# Patient Record
Sex: Female | Born: 1990 | Race: Black or African American | Hispanic: No | Marital: Single | Smoking: Never smoker
Health system: Southern US, Community
[De-identification: ages and names within clinical notes are randomized; demographics above are authoritative.]

## PROBLEM LIST (undated history)

## (undated) DIAGNOSIS — T7840XA Allergy, unspecified, initial encounter: Secondary | ICD-10-CM

## (undated) DIAGNOSIS — T783XXA Angioneurotic edema, initial encounter: Secondary | ICD-10-CM

## (undated) DIAGNOSIS — B009 Herpesviral infection, unspecified: Secondary | ICD-10-CM

## (undated) DIAGNOSIS — D219 Benign neoplasm of connective and other soft tissue, unspecified: Secondary | ICD-10-CM

## (undated) HISTORY — DX: Herpesviral infection, unspecified: B00.9

## (undated) HISTORY — DX: Allergy, unspecified, initial encounter: T78.40XA

## (undated) HISTORY — DX: Angioneurotic edema, initial encounter: T78.3XXA

## (undated) HISTORY — PX: TONSILLECTOMY: SUR1361

---
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
## 2010-05-18 ENCOUNTER — Ambulatory Visit: Payer: Self-pay | Admitting: Family Medicine

## 2010-07-06 ENCOUNTER — Ambulatory Visit: Payer: Self-pay | Admitting: Internal Medicine

## 2010-07-06 ENCOUNTER — Encounter (INDEPENDENT_AMBULATORY_CARE_PROVIDER_SITE_OTHER): Payer: Self-pay | Admitting: Family Medicine

## 2010-07-06 LAB — CONVERTED CEMR LAB
Albumin: 4.8 g/dL (ref 3.5–5.2)
Alkaline Phosphatase: 47 units/L (ref 39–117)
CO2: 25 meq/L (ref 19–32)
Eosinophils Absolute: 0.3 10*3/uL (ref 0.0–0.7)
Glucose, Bld: 88 mg/dL (ref 70–99)
LDL Cholesterol: 104 mg/dL — ABNORMAL HIGH (ref 0–99)
Lymphocytes Relative: 37 % (ref 12–46)
Lymphs Abs: 2 10*3/uL (ref 0.7–4.0)
Neutro Abs: 2.5 10*3/uL (ref 1.7–7.7)
Neutrophils Relative %: 46 % (ref 43–77)
Platelets: 300 10*3/uL (ref 150–400)
Potassium: 4.1 meq/L (ref 3.5–5.3)
Sodium: 136 meq/L (ref 135–145)
TSH: 2.032 microintl units/mL (ref 0.350–4.500)
Total Protein: 7.7 g/dL (ref 6.0–8.3)
Triglycerides: 69 mg/dL (ref <150)
Vit D, 25-Hydroxy: 21 ng/mL — ABNORMAL LOW (ref 30–89)
WBC: 5.5 10*3/uL (ref 4.0–10.5)

## 2010-09-01 ENCOUNTER — Encounter (INDEPENDENT_AMBULATORY_CARE_PROVIDER_SITE_OTHER): Payer: Self-pay | Admitting: Family Medicine

## 2010-09-01 LAB — CONVERTED CEMR LAB: Chlamydia, DNA Probe: NEGATIVE

## 2011-04-13 NOTE — Op Note (Signed)
NAMEMALLEY, HAUTER NO.:  [id number]   MEDICAL RECORD NO.:  [id number]                   PATIENT TYPE:   LOCATION:                                       FACILITY:   PHYSICIAN:  Dorna Leitz, M.D.                 DATE OF BIRTH:   DATE OF PROCEDURE:  06/26/2002  DATE OF DISCHARGE:                                 OPERATIVE REPORT   PREOPERATIVE DIAGNOSES:  Obstructive sleep apnea, recurrent chronic  tonsillitis.   PREOPERATIVE DIAGNOSES:  Obstructive sleep apnea, recurrent chronic  tonsillitis.   PROCEDURE:  Adenotonsillectomy.   SURGEON:  Dorna Leitz, M.D.   ANESTHESIA:  General endotracheal.   ESTIMATED BLOOD LOSS:  30 cc.   SPECIMENS:  Adenoids from tonsils.   COMPLICATIONS:  None.   INDICATIONS FOR PROCEDURE:  This patient is an 20 year old female with an 8  month history of dysphagia and swollen tonsils. There has been frequent sore  throats. In addition, there has been apnea at night and mouth breathing. For  these reasons, adenotonsillectomy is performed.   FINDINGS:  The patient was noted to have kissing bilateral palatine tonsils  which were markedly cryptic and contained a significant amount of tonsillar  debris. In addition, there was debris and an obstructing amount of adenoid  tissue. This tissue was extending into the posterior carina bilaterally.   DESCRIPTION OF PROCEDURE:  The patient was taken to the operating room and  placed on the table in the supine position. She was then placed under  general endotracheal anesthesia and the table rotated counter clockwise 90  degrees. The neck was gently extended using a shoulder roll and the head and  body draped in the usual fashion. A Crowe-Davis mouth gag with a #4 tongue  blade was then placed intraorally, opened and suspended down the Mayo stand.  Palpation of the soft palate was without evidence of any submucosal flecks.  A red rubber catheter was placed down the right  nostril and brought out  through the oral cavity and secured in place with a hemostat. Inspection of  the nasopharynx was performed using a mirror. A medium size adenoid curette  was then placed against the vomer and directly inferiorly severing the  majority of the adenoid pad. The remainder was removed using [facility].  Claire forceps. Two sterile gauze Afrin soaked packs were placed in the  nasopharynx and time allowed for hemostasis. The palate was relaxed and the  right palatine tonsil grasped with Allis clamps and directed inferomedially.  The harmonic scalpel was then used to excise the tonsil staying within the  peritonsillar space adjacent to the tonsillar capsule. The left tonsil was  removed in an identical fashion. The palate was resuspended and packs  removed. Suction cautery was then used to ensure hemostasis and to reduce  adenoid tissue extending into the nasal cavity. The nasopharynx was  copiously irrigated transnasally with normal saline which was suctioned out  through the oral cavity. An NG tube was placed down the esophagus for  suctioning of the gastric contents. The  mouth gag was removed noting no damage to the teeth or soft tissues. The  table was rotated clockwise 90 degrees to its original position.  The  patient was awakened from anesthesia and extubated in the operating room.  She was taken to the post anesthesia care unit in stable condition. There  were no complications.                                               Dorna Leitz, M.D.    SLJ/MEDQ  D:  06/26/2002  T:  07/02/2002  Job:  [id number]   cc:   Maurice March, M.D.

## 2011-08-31 LAB — POCT RAPID STREP A: Streptococcus, Group A Screen (Direct): NEGATIVE

## 2012-03-25 ENCOUNTER — Other Ambulatory Visit: Payer: Self-pay | Admitting: Family Medicine

## 2012-06-02 ENCOUNTER — Emergency Department (HOSPITAL_COMMUNITY)
Admission: EM | Admit: 2012-06-02 | Discharge: 2012-06-02 | Disposition: A | Discharge status: Home / Self Care | Payer: No Typology Code available for payment source | Attending: Emergency Medicine | Admitting: Emergency Medicine

## 2012-06-02 ENCOUNTER — Encounter (HOSPITAL_COMMUNITY): Payer: Self-pay | Admitting: Family Medicine

## 2012-06-02 DIAGNOSIS — M542 Cervicalgia: Secondary | ICD-10-CM | POA: Insufficient documentation

## 2012-06-02 DIAGNOSIS — M25569 Pain in unspecified knee: Secondary | ICD-10-CM | POA: Insufficient documentation

## 2012-06-02 DIAGNOSIS — M25519 Pain in unspecified shoulder: Secondary | ICD-10-CM | POA: Insufficient documentation

## 2012-06-02 MED ORDER — HYDROCODONE-ACETAMINOPHEN 5-325 MG PO TABS: 2.0000 | ORAL_TABLET | ORAL | Status: AC | PRN

## 2012-06-02 MED ORDER — IBUPROFEN 800 MG PO TABS: 800.0000 mg | ORAL_TABLET | Freq: Three times a day (TID) | ORAL | Status: AC

## 2012-06-02 MED ORDER — DIAZEPAM 5 MG PO TABS: 5.0000 mg | ORAL_TABLET | Freq: Two times a day (BID) | ORAL | Status: AC

## 2012-06-02 NOTE — ED Provider Notes (Signed)
Medical screening examination/treatment/procedure(s) were performed by non-physician practitioner and as supervising physician I was immediately available for consultation/collaboration.  Doug Sou, MD 06/02/12 [phone]
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
## 2013-03-17 ENCOUNTER — Ambulatory Visit (INDEPENDENT_AMBULATORY_CARE_PROVIDER_SITE_OTHER): Payer: BC Managed Care – PPO | Admitting: Physician Assistant

## 2013-03-17 VITALS — BP 110/80 | HR 94 | Temp 98.3°F | Resp 16 | Ht 63.75 in | Wt 174.0 lb

## 2013-03-17 DIAGNOSIS — J309 Allergic rhinitis, unspecified: Secondary | ICD-10-CM

## 2013-03-17 DIAGNOSIS — B373 Candidiasis of vulva and vagina: Secondary | ICD-10-CM

## 2013-03-17 DIAGNOSIS — Z202 Contact with and (suspected) exposure to infections with a predominantly sexual mode of transmission: Secondary | ICD-10-CM

## 2013-03-17 DIAGNOSIS — Z309 Encounter for contraceptive management, unspecified: Secondary | ICD-10-CM

## 2013-03-17 DIAGNOSIS — B3731 Acute candidiasis of vulva and vagina: Secondary | ICD-10-CM

## 2013-03-17 DIAGNOSIS — L309 Dermatitis, unspecified: Secondary | ICD-10-CM

## 2013-03-17 DIAGNOSIS — Z2089 Contact with and (suspected) exposure to other communicable diseases: Secondary | ICD-10-CM

## 2013-03-17 DIAGNOSIS — L259 Unspecified contact dermatitis, unspecified cause: Secondary | ICD-10-CM

## 2013-03-17 LAB — POCT URINE PREGNANCY: Preg Test, Ur: NEGATIVE

## 2013-03-17 LAB — POCT WET PREP WITH KOH
KOH Prep POC: POSITIVE
Trichomonas, UA: NEGATIVE
Yeast Wet Prep HPF POC: NEGATIVE

## 2013-03-17 MED ORDER — FLUCONAZOLE 150 MG PO TABS: 150.0000 mg | ORAL_TABLET | Freq: Once | ORAL | Status: DC

## 2013-03-17 MED ORDER — NORGESTIMATE-ETH ESTRADIOL 0.25-35 MG-MCG PO TABS: 1.0000 | ORAL_TABLET | Freq: Every day | ORAL | Status: DC

## 2013-03-17 MED ORDER — TRIAMCINOLONE ACETONIDE 0.1 % EX CREA: TOPICAL_CREAM | Freq: Two times a day (BID) | CUTANEOUS | Status: DC

## 2013-03-17 MED ORDER — CETIRIZINE HCL 10 MG PO TABS: 10.0000 mg | ORAL_TABLET | Freq: Every day | ORAL | Status: DC

## 2013-03-17 MED ORDER — FLUTICASONE PROPIONATE 50 MCG/ACT NA SUSP: 2.0000 | Freq: Every day | NASAL | Status: DC

## 2013-03-17 NOTE — Progress Notes (Signed)
Subjective:    Patient ID: Ruth Graham, female    DOB: 01-20-1991, 22 y.o.   MRN: [id number]  HPI 22 year old female presents for several concerns  #1) Vaginal discharge x 1 week.  Saw GYN on 03/05/13 and was diagnosed with BV and treated x 7 days. 1-2 days after completing this treatment, she noticed a thick, white, discharge. Does have some vaginal pruritis.  Admits that at the last visit with the GYN she had full STI screening and pap.  Everything negative per patient.  She is currently sexually active with 1 female partner and does not use condoms and she is not on any form of contraception.  Does not wish to become pregnant at this time.  LNMP 02/23/13.  Despite negative GC/CL 2 weeks ago, she would like to be tested again today.   #2) Refills on triamcinolone for her eczema. Well controlled with this, has sporadic flares but that is limited.    #3)  Refills on Flonase and Zyrtec that she uses for her environmental allergies - controlled.   Patient otherwise healthy with no other concerns today.     Review of Systems  Constitutional: Negative for fever and chills.  HENT: Negative for congestion and rhinorrhea.   Gastrointestinal: Negative for nausea, vomiting and abdominal pain.  Genitourinary: Positive for vaginal discharge. Negative for dysuria, urgency, flank pain, vaginal bleeding, difficulty urinating and vaginal pain.  Skin: Positive for rash (eczema).  Allergic/Immunologic: Positive for environmental allergies.  Neurological: Negative for dizziness and headaches.       Objective:   Physical Exam  Constitutional: She is oriented to person, place, and time. She appears well-developed and well-nourished.  HENT:  Head: Normocephalic and atraumatic.  Right Ear: External ear normal.  Left Ear: External ear normal.  Eyes: Conjunctivae are normal.  Neck: Normal range of motion.  Cardiovascular: Normal rate, regular rhythm and normal heart sounds.   Pulmonary/Chest: Effort  normal and breath sounds normal.  Abdominal: Bowel sounds are normal. There is no tenderness. There is no rebound and no guarding.  Genitourinary: Vagina normal and uterus normal. Pelvic exam was performed with patient supine. Cervix exhibits discharge (thick, white). Cervix exhibits no motion tenderness and no friability. Right adnexum displays no mass, no tenderness and no fullness. Left adnexum displays no mass, no tenderness and no fullness.  Neurological: She is alert and oriented to person, place, and time.  Psychiatric: She has a normal mood and affect. Her behavior is normal. Judgment and thought content normal.      Results for orders placed in visit on 03/17/13  POCT WET PREP WITH KOH      Result Value Range   Trichomonas, UA Negative     Clue Cells Wet Prep HPF POC 1-3     Epithelial Wet Prep HPF POC 2-3     Yeast Wet Prep HPF POC neg     Bacteria Wet Prep HPF POC 2+     RBC Wet Prep HPF POC 0-2     WBC Wet Prep HPF POC 10-20     KOH Prep POC Positive    POCT URINE PREGNANCY      Result Value Range   Preg Test, Ur Negative         Assessment & Plan:  1. Allergic rhinitis - Plan: fluticasone (FLONASE) 50 MCG/ACT nasal spray, cetirizine (ZYRTEC) 10 MG tablet  -Continue Flonase twice daily   -Zyrtec daily 2. Exposure to venereal disease - Plan: GC/Chlamydia Probe Amp,  POCT Wet Prep with KOH  -Labs pending  -Diflucan 150 mg po x 1 dose. May repeat in 7 days if symptoms persist 3. Eczema - Plan: triamcinolone cream (KENALOG) 0.1 %  -Continue triamcinolone cream as needed for eczema flares 4. Contraception management - Plan: POCT urine pregnancy, norgestimate-ethinyl estradiol (ORTHO-CYCLEN,SPRINTEC,PREVIFEM) 0.25-35 MG-MCG tablet  -HCG today negative  -Safe sex practices  -Have prescribed OCP's to start. Patient is going to decide if she would like to begin therapy. I have strongly urged her  to do so 5.  Yeast vaginitis - Plan: fluconazole (DIFLUCAN) 150 MG tablet  -As  above

## 2013-03-18 LAB — GC/CHLAMYDIA PROBE AMP
CT Probe RNA: NEGATIVE
GC Probe RNA: NEGATIVE

## 2013-03-19 ENCOUNTER — Encounter: Payer: Self-pay | Admitting: Family Medicine

## 2013-05-04 ENCOUNTER — Ambulatory Visit (INDEPENDENT_AMBULATORY_CARE_PROVIDER_SITE_OTHER): Payer: BC Managed Care – PPO | Admitting: Emergency Medicine

## 2013-05-04 VITALS — BP 139/82 | HR 99 | Temp 99.1°F | Resp 16 | Ht 63.0 in | Wt 181.0 lb

## 2013-05-04 DIAGNOSIS — T7840XA Allergy, unspecified, initial encounter: Secondary | ICD-10-CM

## 2013-05-04 DIAGNOSIS — Z202 Contact with and (suspected) exposure to infections with a predominantly sexual mode of transmission: Secondary | ICD-10-CM

## 2013-05-04 DIAGNOSIS — T783XXA Angioneurotic edema, initial encounter: Secondary | ICD-10-CM

## 2013-05-04 DIAGNOSIS — R220 Localized swelling, mass and lump, head: Secondary | ICD-10-CM

## 2013-05-04 DIAGNOSIS — Z2089 Contact with and (suspected) exposure to other communicable diseases: Secondary | ICD-10-CM

## 2013-05-04 HISTORY — DX: Angioneurotic edema, initial encounter: T78.3XXA

## 2013-05-04 LAB — POCT WET PREP WITH KOH: Trichomonas, UA: NEGATIVE

## 2013-05-04 MED ORDER — METHYLPREDNISOLONE SODIUM SUCC 125 MG IJ SOLR
125.0000 mg | Freq: Once | INTRAMUSCULAR | Status: AC
  Administered 2013-05-04: 125 mg via INTRAMUSCULAR

## 2013-05-04 MED ORDER — EPINEPHRINE 0.3 MG/0.3ML IJ SOAJ: 0.3000 mg | Freq: Once | INTRAMUSCULAR | Status: DC

## 2013-05-04 MED ORDER — METRONIDAZOLE 0.75 % VA GEL: 1.0000 | Freq: Two times a day (BID) | VAGINAL | Status: DC

## 2013-05-04 MED ORDER — PREDNISONE 10 MG PO TABS: 10.0000 mg | ORAL_TABLET | Freq: Every day | ORAL | Status: DC

## 2013-05-04 NOTE — Patient Instructions (Signed)
Bacterial Vaginosis Bacterial vaginosis (BV) is a vaginal infection where the normal balance of bacteria in the vagina is disrupted. The normal balance is then replaced by an overgrowth of certain bacteria. There are several different kinds of bacteria that can cause BV. BV is the most common vaginal infection in women of childbearing age. CAUSES   The cause of BV is not fully understood. BV develops when there is an increase or imbalance of harmful bacteria.  Some activities or behaviors can upset the normal balance of bacteria in the vagina and put women at increased risk including:  Having a new sex partner or multiple sex partners.  Douching.  Using an intrauterine device (IUD) for contraception.  It is not clear what role sexual activity plays in the development of BV. However, women that have never had sexual intercourse are rarely infected with BV. Women do not get BV from toilet seats, bedding, swimming pools or from touching objects around them.  SYMPTOMS   Grey vaginal discharge.  A fish-like odor with discharge, especially after sexual intercourse.  Itching or burning of the vagina and vulva.  Burning or pain with urination.  Some women have no signs or symptoms at all. DIAGNOSIS  Your caregiver must examine the vagina for signs of BV. Your caregiver will perform lab tests and look at the sample of vaginal fluid through a microscope. They will look for bacteria and abnormal cells (clue cells), a pH test higher than 4.5, and a positive amine test all associated with BV.  RISKS AND COMPLICATIONS   Pelvic inflammatory disease (PID).  Infections following gynecology surgery.  Developing HIV.  Developing herpes virus. TREATMENT  Sometimes BV will clear up without treatment. However, all women with symptoms of BV should be treated to avoid complications, especially if gynecology surgery is planned. Female partners generally do not need to be treated. However, BV may spread  between female sex partners so treatment is helpful in preventing a recurrence of BV.   BV may be treated with antibiotics. The antibiotics come in either pill or vaginal cream forms. Either can be used with nonpregnant or pregnant women, but the recommended dosages differ. These antibiotics are not harmful to the baby.  BV can recur after treatment. If this happens, a second round of antibiotics will often be prescribed.  Treatment is important for pregnant women. If not treated, BV can cause a premature delivery, especially for a pregnant woman who had a premature birth in the past. All pregnant women who have symptoms of BV should be checked and treated.  For chronic reoccurrence of BV, treatment with a type of prescribed gel vaginally twice a week is helpful. HOME CARE INSTRUCTIONS   Finish all medication as directed by your caregiver.  Do not have sex until treatment is completed.  Tell your sexual partner that you have a vaginal infection. They should see their caregiver and be treated if they have problems, such as a mild rash or itching.  Practice safe sex. Use condoms. Only have 1 sex partner. PREVENTION  Basic prevention steps can help reduce the risk of upsetting the natural balance of bacteria in the vagina and developing BV:  Do not have sexual intercourse (be abstinent).  Do not douche.  Use all of the medicine prescribed for treatment of BV, even if the signs and symptoms go away.  Tell your sex partner if you have BV. That way, they can be treated, if needed, to prevent reoccurrence. SEEK MEDICAL CARE IF:     Your symptoms are not improving after 3 days of treatment.  You have increased discharge, pain, or fever. MAKE SURE YOU:   Understand these instructions.  Will watch your condition.  Will get help right away if you are not doing well or get worse. FOR MORE INFORMATION  Division of STD Prevention (DSTDP), Centers for Disease Control and Prevention:  SolutionApps.[employer]za American Social Health Association (ASHA): www.ashastd.org  Document Released: 11/12/2005 Document Revised: 02/04/2012 Document Reviewed: 05/05/2009 [facility] Patient Information 2014 [place], [place]. Angioedema Angioedema (AE) is a sudden swelling of the eyelids, lips, lobes of ears, external genitalia, skin, and other parts of the body. AE can happen by itself. It usually begins during the night and is found on awakening. It can happen with hives and other allergic reactions. Attacks can be mild and annoying, or life-threatening if the air passages swell. AE generally occurs in a short time period (over minutes to hours) and gets better in 24 to 48 hours. It usually does not cause any serious problems.  There are 2 different kinds of AE:   Allergic AE.  Nonallergic AE.  There may be an overreaction or direct stimulation of cells that are a part of the immune system (mast cells).  There may be problems with the release of chemicals made by the body that cause swelling and inflammation (kinins). AE due to kinins can be inherited from parents (hereditary), or it can develop on its own (acquired). Acquired AE either shows up before, or along with, certain diseases or is due to the body's immune system attacking parts of the body's own cells (autoimmune). CAUSES  Allergic  AE due to allergic reactions are caused by something that causes the body to react (trigger). Common triggers include:  Foods.  Medicines.  Latex.  Direct contact with certain fruits, vegetables, or animal saliva.  Insect stings. Nonallergic  Mast cell stimulation may be caused by:  Medicines.  Dyes used in X-rays.  The body's own immune system reactions to parts of the body (autoimmune disease).  Possibly, some virus infections.  AE due to problems with kinins can be hereditary or acquired. Attacks are triggered by:  Mild injury.  Dental work or any surgery.  Stress.  Sudden changes in  temperature.  Exercise.  Medicines.  AE due to problems with kinins can also be due to certain medicines, especially blood pressure medicines like angiotensin-converting enzyme (ACE) inhibitors. African Americans are at nearly 5 times greater risk of developing AE than Caucasians from ACE inhibitors. SYMPTOMS  Allergic symptoms:  Non-itchy swelling of the skin. Often the swelling is on the face and lips, but any area of the skin can swell. Sometimes, the swelling can be painful. If hives are present, there is intense itching.  Breathing problems if the air passages swell. Nonallergic symptoms:  If internal organs are involved, there may be:  Nausea.  Abdominal pain.  Vomiting.  Difficulty swallowing.  Difficulty passing urine.  Breathing problems if the air passages swell. Depending on the cause of AE, episodes may:  Only happen once (if triggers are removed or avoided).  Come back in unpredictable patterns.  Repeat for several years and then gradually fade away. DIAGNOSIS  AE is diagnosed by:   Asking questions to find out how fast the symptoms began.  Taking a family history.  Physical exam.  Diagnostic tests. Tests could include:  Allergy skin tests to see if the problem is allergic.  Blood tests to diagnose hereditary and some acquired types of AE.  Other tests to see if there is a hidden disease leading to the AE. TREATMENT  Treatment depends on the type and cause (if any) of the AE. Allergic  Allergic types of AE are treated with:  Immediate removal of the trigger or medicine (if any).  Epinephrine injection.  Steroids.  Antihistamines.  Hospitalization for severe attacks. Nonallergic  Mast cell stimulation types of AE are treated with:  Immediate removal of the trigger or medicine (if any).  Epinephrine injection.  Steroids.  Antihistamines.  Hospitalization for severe attacks.  Hereditary AE is treated with:  Medicines to  prevent and treat attacks. There is little response to antihistamines, epinephrine, or steroids.  Preventive medicines before dental work or surgery.  Removing or avoiding medicines that trigger attacks.  Hospitalization for severe attacks.  Acquired AE is treated with:  Treating underlying disease (if any).  Medicines to prevent and treat attacks. HOME CARE INSTRUCTIONS   Always carry your emergency allergy treatment medicines with you.  Wear a medical bracelet.  Avoid known triggers. SEEK MEDICAL CARE IF:   You get repeat attacks.  Your attacks are more frequent or more severe despite preventive measures.  You have hereditary AE and are considering having children. It is important to discuss the risks of passing this on to your children. SEEK IMMEDIATE MEDICAL CARE IF:   You have difficulty breathing.  You have difficulty swallowing.  You experience fainting. This condition should be treated immediately. It can be life-threatening if it involves throat swelling. Document Released: 01/21/2002 Document Revised: 02/04/2012 Document Reviewed: 11/11/2008 [facility] Patient Information 2014 [place], [place].

## 2013-05-04 NOTE — Progress Notes (Signed)
  Subjective:    Patient ID: Ruth Graham, female    DOB: 1991-01-07, 22 y.o.   MRN: [id number]  HPI problem #1 is swelling of the right side of the face. Last night she had hot dogs, hamburger,s and dorito chips at 11 PM last night she noticed swelling of her right side of the cheek her upper and lower lip on the right side. She did not have any respiratory difficulty she did not feel short of breath. She felt no swelling on the inside of her mouth or throat. She has had 3-4 episodes of this in the past. 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 She has not had any recent problems. She does not have an EpiPen to carry . Problem #2 is possible STD exposure. She is sexually active. She does not use birth control. She denies having a discharge. States she last had STD testing in April.   Review of Systems     Objective:   Physical Exam patient is alert and cooperative she is not in distress. There is swelling of the right upper lip and lower lip. There is puffiness to the right cheek area. There is no swelling of the uvula or posterior pharynx. No stridor is heard  .She is clear to both auscultation and percussion. Heart  exam reveals a regular rate and rhythm  The abdomen is soft. The cervix is extremely friable. There are no adnexal masses present. There is a thick yellowish discharge in the vault. Results for orders placed in visit on 05/04/13  POCT URINE PREGNANCY      Result Value Range   Preg Test, Ur Negative    POCT WET PREP WITH KOH      Result Value Range   Trichomonas, UA Negative     Clue Cells Wet Prep HPF POC 6-8     Epithelial Wet Prep HPF POC 8-12     Yeast Wet Prep HPF POC neg     Bacteria Wet Prep HPF POC 2+     RBC Wet Prep HPF POC 1-5     WBC Wet Prep HPF POC 10-15     KOH Prep POC Negative         Assessment & Plan:  Patient treated with Solu-Medrol 125 mg IM Zyrtec 10 mg a by mouth and ranitidine 300 mg by mouth.  STD testing was done she is having a Pap 3 done to include gonorrhea Chlamydia testing she also had testing for syphilis HIV and hep C and HSV 1 and 2. BV to be treated once her angioedema is resolved we'll treat with MetroGel vaginal.Prednisone taper ordered with referral to allergist. Instucted on epi pen use

## 2013-05-05 LAB — RPR

## 2013-05-05 LAB — HSV(HERPES SIMPLEX VRS) I + II AB-IGG: HSV 1 Glycoprotein G Ab, IgG: 8.26 IV — ABNORMAL HIGH

## 2013-05-05 LAB — HEPATITIS C ANTIBODY: HCV Ab: NEGATIVE

## 2013-05-05 LAB — PAP IG, CT-NG, RFX HPV ASCU

## 2013-05-06 ENCOUNTER — Telehealth: Payer: Self-pay

## 2013-05-06 MED ORDER — METRONIDAZOLE 500 MG PO TABS: 500.0000 mg | ORAL_TABLET | Freq: Two times a day (BID) | ORAL | Status: DC

## 2013-05-06 NOTE — Telephone Encounter (Signed)
Can we do this for her? 

## 2013-05-06 NOTE — Telephone Encounter (Signed)
Sent PO Flagyl to pharmacy.  She CANNOT consume alcohol with this medication or for 48 hours after last dose.

## 2013-05-06 NOTE — Telephone Encounter (Signed)
Pt states that she prescribed a cream for BV but she would rather take the pills. Best# [phone]  Pharmacy:[facility] [address]

## 2013-05-06 NOTE — Telephone Encounter (Signed)
lmom that rx was sent into pharmacy 

## 2013-05-07 ENCOUNTER — Ambulatory Visit (INDEPENDENT_AMBULATORY_CARE_PROVIDER_SITE_OTHER): Payer: BC Managed Care – PPO | Admitting: Family Medicine

## 2013-05-07 VITALS — BP 153/70 | HR 110 | Temp 99.1°F | Resp 16 | Ht 63.0 in | Wt 181.0 lb

## 2013-05-07 DIAGNOSIS — B009 Herpesviral infection, unspecified: Secondary | ICD-10-CM

## 2013-05-07 MED ORDER — VALACYCLOVIR HCL 1 G PO TABS: 1000.0000 mg | ORAL_TABLET | Freq: Two times a day (BID) | ORAL | Status: DC

## 2013-05-07 NOTE — Progress Notes (Signed)
Subjective: Patient is here to followup on her recent labs. 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 She is doing better from her angioedema.  Objective: Did not examine her  Assessment: HSV 2  Plan: Had a long discussion with her explaining options. Since this is the first time she's been diagnosed, even though she did not have an outbreak, I decided to go ahead and give her a round of treatment. This can give her a little more comfort on something been done about it. She is pretty discouraged with being positive. She is known with her boyfriend. She only had one sexual partner for the last year. Told her to call us if she has any acute outbreaks at which time should be treated promptly.

## 2013-05-07 NOTE — Patient Instructions (Signed)
Genital Herpes  Genital herpes is a sexually transmitted disease. This means that it is a disease passed by having sex with an infected person. There is no cure for genital herpes. The time between attacks can be months to years. The virus may live in a person but produce no problems (symptoms). This infection can be passed to a baby as it travels down the birth canal (vagina). In a newborn, this can cause central nervous system damage, eye damage, or even death. The virus that causes genital herpes is usually HSV-2 virus. The virus that causes oral herpes is usually HSV-1. The diagnosis (learning what is wrong) is made through culture results.  SYMPTOMS   Usually symptoms of pain and itching begin a few days to a week after contact. It first appears as small blisters that progress to small painful ulcers which then scab over and heal after several days. It affects the outer genitalia, birth canal, cervix, penis, anal area, buttocks, and thighs.  HOME CARE INSTRUCTIONS   · Keep ulcerated areas dry and clean.  · Take medications as directed. Antiviral medications can speed up healing. They will not prevent recurrences or cure this infection. These medications can also be taken for suppression if there are frequent recurrences.  · While the infection is active, it is contagious. Avoid all sexual contact during active infections.  · Condoms may help prevent spread of the herpes virus.  · Practice safe sex.  · Wash your hands thoroughly after touching the genital area.  · Avoid touching your eyes after touching your genital area.  · Inform your caregiver if you have had genital herpes and become pregnant. It is your responsibility to insure a safe outcome for your baby in this pregnancy.  · Only take over-the-counter or prescription medicines for pain, discomfort, or fever as directed by your caregiver.  SEEK MEDICAL CARE IF:   · You have a recurrence of this infection.  · You do not respond to medications and are not  improving.  · You have new sources of pain or discharge which have changed from the original infection.  · You have an oral temperature above 102° F (38.9° C).  · You develop abdominal pain.  · You develop eye pain or signs of eye infection.  Document Released: 11/09/2000 Document Revised: 02/04/2012 Document Reviewed: 11/30/2009  ExitCare® Patient Information ©2014 [employer].

## 2014-01-16 ENCOUNTER — Ambulatory Visit (INDEPENDENT_AMBULATORY_CARE_PROVIDER_SITE_OTHER): Payer: BC Managed Care – PPO | Admitting: Physician Assistant

## 2014-01-16 VITALS — BP 110/68 | HR 110 | Temp 98.0°F | Resp 16 | Ht 63.0 in | Wt 165.0 lb

## 2014-01-16 DIAGNOSIS — B373 Candidiasis of vulva and vagina: Secondary | ICD-10-CM

## 2014-01-16 DIAGNOSIS — B3731 Acute candidiasis of vulva and vagina: Secondary | ICD-10-CM

## 2014-01-16 DIAGNOSIS — B009 Herpesviral infection, unspecified: Secondary | ICD-10-CM | POA: Insufficient documentation

## 2014-01-16 DIAGNOSIS — N898 Other specified noninflammatory disorders of vagina: Secondary | ICD-10-CM

## 2014-01-16 LAB — POCT UA - MICROSCOPIC ONLY
CASTS, UR, LPF, POC: NEGATIVE
Crystals, Ur, HPF, POC: NEGATIVE
Mucus, UA: NEGATIVE
YEAST UA: NEGATIVE

## 2014-01-16 LAB — POCT WET PREP WITH KOH
KOH Prep POC: POSITIVE
TRICHOMONAS UA: NEGATIVE
Yeast Wet Prep HPF POC: NEGATIVE

## 2014-01-16 LAB — POCT URINALYSIS DIPSTICK
Bilirubin, UA: NEGATIVE
Blood, UA: NEGATIVE
GLUCOSE UA: NEGATIVE
Leukocytes, UA: NEGATIVE
NITRITE UA: NEGATIVE
Spec Grav, UA: 1.025
UROBILINOGEN UA: 1
pH, UA: 7

## 2014-01-16 MED ORDER — FLUCONAZOLE 150 MG PO TABS: 150.0000 mg | ORAL_TABLET | Freq: Once | ORAL | Status: DC

## 2014-01-16 NOTE — Progress Notes (Signed)
Subjective:    Patient ID: Ruth Graham, female    DOB: 06-30-1991, 23 y.o.   MRN: [id number]  PCP: [name] Patient  Chief Complaint  Patient presents with  . Vaginal Discharge    and odor    Medications, allergies, past medical history, surgical history, family history, social history and problem list reviewed and updated.  HPI  Reports vaginal discharge and vaginal odor for about a month.  In late January, she was seen at GYN, where she was diagnosed with BV and bladder infection. Completed treatment.  Had some improvement in symptoms temporarily, but symptoms worsened again.  She denies having had urinary urgency, frequency or burning since the vaginal symptoms began.  No nausea, vomiting, back or abdominal pain.   1 current sexual partner x several months. 2 in past 3 months, 3 in past 12 months, 7 lifetime partners. Last STI testing at the GYN office last month.  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  She reports having been told by a staff person at the GYN office that she could not transmit HSV to a partner if she wasn't having an outbreak.  She was uncertain, so asked me for clarification, and was very upset to learn that HSV can be transmitted to uninfected partners in the absence of an outbreak.  Review of Systems As above.    Objective:   Physical Exam  Vitals reviewed. Constitutional: She is oriented to person, place, and time. She appears well-developed and well-nourished. No distress.  HENT:  Head: Normocephalic and atraumatic.  Eyes: Conjunctivae are normal. No scleral icterus.  Neck: Neck supple. No thyromegaly present.  Cardiovascular: Normal rate, regular rhythm and normal heart sounds.   Pulmonary/Chest: Effort normal and breath sounds normal.  Abdominal: Soft. Bowel sounds are normal. She exhibits no mass. There is no tenderness. Hernia confirmed negative in the right inguinal area and confirmed negative  in the left inguinal area.  Genitourinary: Uterus normal. Pelvic exam was performed with patient supine. No labial fusion. There is no rash, tenderness, lesion or injury on the right labia. There is no rash, tenderness, lesion or injury on the left labia. No erythema, tenderness or bleeding around the vagina. No foreign body around the vagina. No signs of injury around the vagina. Vaginal discharge found.  Lymphadenopathy:    She has no cervical adenopathy.       Right: No inguinal adenopathy present.       Left: No inguinal adenopathy present.  Neurological: She is alert and oriented to person, place, and time.  Skin: Skin is warm and dry.  Psychiatric: She has a normal mood and affect. Her behavior is normal.      Results for orders placed in visit on 01/16/14  POCT UA - MICROSCOPIC ONLY      Result Value Ref Range   WBC, Ur, HPF, POC 1-3     RBC, urine, microscopic 1-3     Bacteria, U Microscopic trace     Mucus, UA neg     Epithelial cells, urine per micros 0-2     Crystals, Ur, HPF, POC neg     Casts, Ur, LPF, POC neg     Yeast, UA neg    POCT URINALYSIS DIPSTICK      Result Value Ref Range   Color, UA yellow     Clarity, UA clear     Glucose, UA neg  Bilirubin, UA neg     Ketones, UA trace     Spec Grav, UA 1.025     Blood, UA neg     pH, UA 7.0     Protein, UA trace     Urobilinogen, UA 1.0     Nitrite, UA neg     Leukocytes, UA Negative    POCT WET PREP WITH KOH      Result Value Ref Range   Trichomonas, UA Negative     Clue Cells Wet Prep HPF POC 0-1     Epithelial Wet Prep HPF POC 1-5     Yeast Wet Prep HPF POC neg     Bacteria Wet Prep HPF POC 2+     RBC Wet Prep HPF POC 2-4     WBC Wet Prep HPF POC 5-10     KOH Prep POC Positive         Assessment & Plan:  1. Vaginal discharge - POCT UA - Microscopic Only - POCT urinalysis dipstick - POCT Wet Prep with KOH  2. Vaginal yeast infection - fluconazole (DIFLUCAN) 150 MG tablet; Take 1 tablet (150 mg  total) by mouth once. Repeat if needed  Dispense: 2 tablet; Refill: 0  Counseled regarding HSV transmission risk.  Encouraged her to get her partner tested by IgG. If his test is negative, would advise her to take daily suppressive therapy.  Fara Chute, PA-C Physician Assistant-Certified [facility]

## 2014-01-16 NOTE — Patient Instructions (Addendum)
Use condoms consistently to prevent pregnancy and sexually transmitted infections. Consider discussing additional birth control options with your gynecologist.  Additionally, discuss HSV infection with your partner.  He can get tested (blood test) for herpes.  If his test is NEGATIVE, I recommend that YOU take Valtrex every day to reduce the risk of him getting the infection. Remember that HSV CAN be transmitted even when you are not having an outbreak.

## 2014-01-24 ENCOUNTER — Ambulatory Visit (INDEPENDENT_AMBULATORY_CARE_PROVIDER_SITE_OTHER): Payer: BC Managed Care – PPO | Admitting: Internal Medicine

## 2014-01-24 VITALS — BP 110/74 | HR 96 | Temp 98.1°F | Resp 16 | Ht 63.0 in | Wt 166.8 lb

## 2014-01-24 DIAGNOSIS — J019 Acute sinusitis, unspecified: Secondary | ICD-10-CM

## 2014-01-24 MED ORDER — AMOXICILLIN 500 MG PO CAPS: 1000.0000 mg | ORAL_CAPSULE | Freq: Two times a day (BID) | ORAL | Status: DC

## 2014-01-24 NOTE — Progress Notes (Signed)
   Subjective:    Patient ID: Ruth Graham, female    DOB: 1991-07-28, 23 y.o.   MRN: [id number]  HPI  23 y.o. Female presents to clinic with sinus pain , congestion and runny nose. States that when she blows nose its yellow in color. Reports hearing a crackling noise in her ears when she blows her nose. States that symptoms started on wed. Denies any fever, sore throat or cough.   Review of Systems     Objective:   Physical Exam        Assessment & Plan:

## 2014-01-24 NOTE — Patient Instructions (Signed)

## 2014-01-29 ENCOUNTER — Telehealth: Payer: Self-pay

## 2014-01-29 DIAGNOSIS — J309 Allergic rhinitis, unspecified: Secondary | ICD-10-CM

## 2014-01-29 NOTE — Telephone Encounter (Signed)
Dr Elder Cyphers , do you want to Rx an inhaler?

## 2014-01-29 NOTE — Telephone Encounter (Signed)
Patient called wanting to receive an inhaler from Dr. Elder Cyphers. Patient was seen by Dr. Elder Cyphers, and may have suggested an inhaler. Patient would like a call back at [phone].      Thank You!!!

## 2014-02-01 ENCOUNTER — Telehealth: Payer: Self-pay

## 2014-02-01 MED ORDER — FLUTICASONE PROPIONATE 50 MCG/ACT NA SUSP: 2.0000 | Freq: Every day | NASAL | Status: DC

## 2014-02-01 NOTE — Telephone Encounter (Signed)
Patient has some more questions about her nasal spray. Is under the impression that there is another type of nasal spray that she is supposed to have.  [phone]

## 2014-02-01 NOTE — Telephone Encounter (Signed)
Sent in fluticasone NS RFs. LMOM for pt that this was done and asked for CB if she is requesting an inhaler of some kind instead. Get details of Sxs.

## 2014-02-01 NOTE — Telephone Encounter (Signed)
Ok to rf fluticasone nasal spray with 3 rfs

## 2014-02-02 NOTE — Telephone Encounter (Signed)
Pt assured that the nasal spray was called in for her per Dr. Elder Cyphers recommendation.

## 2014-02-22 ENCOUNTER — Ambulatory Visit (INDEPENDENT_AMBULATORY_CARE_PROVIDER_SITE_OTHER): Payer: BC Managed Care – PPO | Admitting: Physician Assistant

## 2014-02-22 VITALS — BP 108/76 | HR 98 | Temp 98.4°F | Resp 18 | Ht 63.0 in | Wt 161.0 lb

## 2014-02-22 DIAGNOSIS — J310 Chronic rhinitis: Secondary | ICD-10-CM

## 2014-02-22 DIAGNOSIS — T192XXA Foreign body in vulva and vagina, initial encounter: Secondary | ICD-10-CM

## 2014-02-22 MED ORDER — PREDNISONE 20 MG PO TABS: ORAL_TABLET | ORAL | Status: DC

## 2014-02-22 NOTE — Progress Notes (Signed)
Subjective:    Patient ID: Ruth Graham, female    DOB: [birth year], 23 y.o.   MRN: [id number]   PCP: [name] Patient  Chief Complaint  Patient presents with  . Follow-up    1 month, sinus infection, better but not completely gone    Medications, allergies, past medical history, surgical history, family history, social history and problem list reviewed and updated.  HPI  Was seen here 01/24/2014 with sinus pain, congestion, yellow nasal drainage, crackling in ears with blowing her nose.  She was treated with Amoxicillin, and reports that it seems to be draining better, but the symptoms persist. She has some Headache, mild. No fever, chills, nausea, vomiting or dizziness. Currently uses Flonase and Zyrtec daily.  In addition, she's concerned that the tampon she inserted this morning at 10 am has disappeared, "I can't find it."  Review of Systems As above.    Objective:   Physical Exam  Vitals reviewed. Constitutional: She is oriented to person, place, and time. Vital signs are normal. She appears well-developed and well-nourished. No distress.  HENT:  Head: Normocephalic and atraumatic.  Right Ear: Hearing, tympanic membrane, external ear and ear canal normal.  Left Ear: Hearing, tympanic membrane, external ear and ear canal normal.  Nose: Mucosal edema and rhinorrhea present.  No foreign bodies. Right sinus exhibits no maxillary sinus tenderness and no frontal sinus tenderness. Left sinus exhibits no maxillary sinus tenderness and no frontal sinus tenderness.  Mouth/Throat: Uvula is midline, oropharynx is clear and moist and mucous membranes are normal. No uvula swelling. No oropharyngeal exudate.  Eyes: Conjunctivae and EOM are normal. Pupils are equal, round, and reactive to light. Right eye exhibits no discharge. Left eye exhibits no discharge. No scleral icterus.  Neck: Trachea normal, normal range of motion and full passive range of motion without pain. Neck supple. No  mass and no thyromegaly present.  Cardiovascular: Normal rate, regular rhythm and normal heart sounds.   Pulmonary/Chest: Effort normal and breath sounds normal.  Abdominal: Hernia confirmed negative in the right inguinal area and confirmed negative in the left inguinal area.  Genitourinary: Pelvic exam was performed with patient supine. No labial fusion. There is no rash, tenderness, lesion or injury on the right labia. There is no rash, tenderness, lesion or injury on the left labia. There is bleeding around the vagina. No erythema or tenderness around the vagina. There is a foreign body around the vagina. No signs of injury around the vagina. No vaginal discharge found.  Tampon found in the vaginal vault. Removed easily with ring forceps.  Lymphadenopathy:       Head (right side): No submandibular, no tonsillar, no preauricular, no posterior auricular and no occipital adenopathy present.       Head (left side): No submandibular, no tonsillar, no preauricular and no occipital adenopathy present.    She has no cervical adenopathy.       Right: No inguinal and no supraclavicular adenopathy present.       Left: No inguinal and no supraclavicular adenopathy present.  Neurological: She is alert and oriented to person, place, and time. She has normal strength. No cranial nerve deficit or sensory deficit.  Skin: Skin is warm, dry and intact. No rash noted.  Psychiatric: She has a normal mood and affect. Her speech is normal and behavior is normal.          Assessment & Plan:  1. Rhinitis Suspect infection resolved. Continue Flonase and Zyrtec. Add course of  oral steroids. If no better, consider broad-spectrum antibiotic.  2. Retained tampon Removed.  Meds ordered this encounter  Medications  . predniSONE (DELTASONE) 20 MG tablet    Sig: Take 3 PO QAM x3days, 2 PO QAM x3days, 1 PO QAM x3days    Dispense:  18 tablet    Refill:  0    Order Specific Question:  Supervising Provider     Answer:  DOOLITTLE, ROBERT P [[id number]]    Fara Chute, PA-C Physician Assistant-Certified [facility]

## 2014-02-22 NOTE — Patient Instructions (Signed)
Continue the Flonase nasal spray for your allergies. Take the prednisone each morning, with food. If your symptoms persist, please return for re-evaluation.

## 2014-03-24 ENCOUNTER — Ambulatory Visit (INDEPENDENT_AMBULATORY_CARE_PROVIDER_SITE_OTHER): Payer: BC Managed Care – PPO | Admitting: Physician Assistant

## 2014-03-24 VITALS — BP 132/81 | HR 98 | Temp 98.5°F | Resp 18 | Wt 169.0 lb

## 2014-03-24 DIAGNOSIS — Z113 Encounter for screening for infections with a predominantly sexual mode of transmission: Secondary | ICD-10-CM

## 2014-03-24 DIAGNOSIS — J309 Allergic rhinitis, unspecified: Secondary | ICD-10-CM

## 2014-03-24 DIAGNOSIS — N898 Other specified noninflammatory disorders of vagina: Secondary | ICD-10-CM

## 2014-03-24 LAB — POCT WET PREP WITH KOH
Clue Cells Wet Prep HPF POC: NEGATIVE
KOH Prep POC: NEGATIVE
RBC Wet Prep HPF POC: NEGATIVE
Trichomonas, UA: NEGATIVE
Yeast Wet Prep HPF POC: NEGATIVE

## 2014-03-24 NOTE — Progress Notes (Signed)
Subjective:    Patient ID: Ruth Graham, female    DOB: [birth year], 23 y.o.   MRN: [id number]   HPI 23 year old female presents for evaluation of "follow up on sinus infection." Was seen here on 01/24/14 and treated with amoxicillin x 10 days.  She was seen again on 3/30 and at that time felt like her symptoms had improved but "were not completely gone." Treated at that time with a course of prednisone. She is on daily Zyrtec and Flonase.  Is here today and believes she needs another course of antibiotic, however she denies any nasal congestion, sinus pain, or ear pressure. States she "sometimes" has some popping in her ears. Otherwise, her symptoms seem to have resolved. No fever, chills, nausea, vomiting, or dizziness.    Also is here because she noticed "an odor" this morning. Denies any rash, pruritis, or dysuria. Complains of slight abdominal discomfort and vaginal discharge. Hx of BV and yeast infections in the past. She is not currently sexually active but has had new partners since last STI screening.  No specific concerns about STI's but is willing to have a GC/CL screen today.     Review of Systems  Constitutional: Negative for fever and chills.  HENT: Negative for congestion, ear pain, postnasal drip, rhinorrhea, sinus pressure and sore throat.   Gastrointestinal: Negative for nausea and vomiting.  Genitourinary: Positive for vaginal discharge. Negative for dysuria, vaginal bleeding and vaginal pain.  Neurological: Negative for dizziness and headaches.       Objective:   Physical Exam  Constitutional: She is oriented to person, place, and time. She appears well-developed and well-nourished.  HENT:  Head: Normocephalic and atraumatic.  Right Ear: External ear normal.  Left Ear: External ear normal.  Eyes: Conjunctivae are normal.  Neck: Normal range of motion. Neck supple.  Cardiovascular: Normal rate.   Pulmonary/Chest: Effort normal and breath sounds normal.  Genitourinary:  Vagina normal and uterus normal. Pelvic exam was performed with patient supine. There is no rash or lesion on the right labia. There is no rash or lesion on the left labia. Cervix exhibits discharge (thin, clear, stringy discharge ). Cervix exhibits no motion tenderness and no friability. Right adnexum displays no mass, no tenderness and no fullness. Left adnexum displays no mass, no tenderness and no fullness.  Lymphadenopathy:    She has no cervical adenopathy.  Neurological: She is alert and oriented to person, place, and time.  Psychiatric: She has a normal mood and affect. Her behavior is normal. Judgment and thought content normal.     Results for orders placed in visit on 03/24/14  POCT WET PREP WITH KOH      Result Value Ref Range   Trichomonas, UA Negative     Clue Cells Wet Prep HPF POC neg     Epithelial Wet Prep HPF POC 0-1     Yeast Wet Prep HPF POC neg     Bacteria Wet Prep HPF POC trace     RBC Wet Prep HPF POC neg     WBC Wet Prep HPF POC 0-3     KOH Prep POC Negative          Assessment & Plan:   Leukorrhea, not specified as infective - Plan: POCT Wet Prep with KOH, GC/Chlamydia Probe Amp  Routine screening for STI (sexually transmitted infection) - Plan: POCT Wet Prep with KOH, GC/Chlamydia Probe Amp  Allergic rhinitis  GC/CL pending.  Wet prep unremarkable today.  Reassurance  provided. Recheck if symptoms worsening or changing.   Patient is asymptomatic at this time - no evidence of a sinus infection.  Sinus pain and ETD seems to have improved. She insists that "another round of amoxicillin" is what she needs. I do not think this will help. Recommend she continue daily Zyrtec and Flonase. Recheck if symptoms worsen or fail to improve.

## 2014-03-25 LAB — GC/CHLAMYDIA PROBE AMP
CT Probe RNA: NEGATIVE
GC Probe RNA: NEGATIVE

## 2014-04-01 ENCOUNTER — Other Ambulatory Visit: Payer: Self-pay | Admitting: Internal Medicine

## 2014-04-07 ENCOUNTER — Telehealth: Payer: Self-pay

## 2014-04-07 NOTE — Telephone Encounter (Signed)
Antibiotics for sinus infection - seen last month.   [phone]

## 2014-04-07 NOTE — Telephone Encounter (Signed)
Cannot send an antibiotic without evaluating her.

## 2014-04-07 NOTE — Telephone Encounter (Signed)
Pt.notified

## 2014-04-07 NOTE — Telephone Encounter (Signed)
Patient states she is having sinus headaches and wax builup in ears and it is making a popping noise.  She is also having chest congestion.  She said it is still "lingering" since her last visit and would like to get an antibiotic.  Please advise.  [facility]

## 2014-09-14 ENCOUNTER — Ambulatory Visit (INDEPENDENT_AMBULATORY_CARE_PROVIDER_SITE_OTHER): Payer: BC Managed Care – PPO | Admitting: Physician Assistant

## 2014-09-14 VITALS — BP 128/90 | HR 100 | Temp 99.2°F | Resp 18 | Wt 171.0 lb

## 2014-09-14 DIAGNOSIS — J329 Chronic sinusitis, unspecified: Secondary | ICD-10-CM

## 2014-09-14 MED ORDER — IPRATROPIUM BROMIDE 0.03 % NA SOLN: 2.0000 | Freq: Two times a day (BID) | NASAL | Status: DC

## 2014-09-14 MED ORDER — GUAIFENESIN ER 1200 MG PO TB12: 1.0000 | ORAL_TABLET | Freq: Two times a day (BID) | ORAL | Status: DC | PRN

## 2014-09-14 NOTE — Progress Notes (Signed)
   Subjective:    Patient ID: Ruth Graham, female    DOB: 03-06-1991, 24 y.o.   MRN: [id number]  Sinusitis   Ms. Chasya is a 23 y.o. female with pmh of seasonal allergies presenting for 5 day history of worsening sinus pain. Patient has been blowing nose out with greenish, yellow or white mucus. Associated symptoms include sinus headache, rhinorrhea, pressure in her ears, mild cough, chest congestion. Denies fevers, chills, n/v, eye pain, red eyes, throat pain, difficulty swallowing, sob, wheezing, myalgias, fatigue, chest pain, abdominal pain, urinary changes, diarrhea. Patient has been taking her seasonal allergy medicine but nothing else for her current symptoms. She has taken her flu vaccine this season, within the past month. Otherwise, she's had 1 sick contact, had similar sinus symptoms for the last week. Denies any other aggravating or relieving factors, no other questions or concerns.   Prior to Admission medications   Medication Sig Start Date End Date Taking? Authorizing Provider  cetirizine (ZYRTEC) 10 MG tablet Take 1 tablet (10 mg total) by mouth daily. 03/17/13  Yes Wel[place], PA-C  fluticasone (FLONASE) 50 MCG/ACT nasal spray Place 2 sprays into both nostrils daily. 02/01/14  Yes Orma Flaming, MD    No Known Allergies   Review of Systems As in subjective.    Objective:   Physical Exam  Constitutional: She is oriented to person, place, and time. She appears well-developed and well-nourished. No distress.  BP 128/90  Pulse 100  Temp(Src) 99.2 F (37.3 C) (Oral)  Resp 18  Wt 171 lb (77.565 kg)  SpO2 96%  LMP 08/31/2014   HENT:  Head: Normocephalic and atraumatic.  Right Ear: External ear normal.  Left Ear: External ear normal.  Nose: Mucosal edema and rhinorrhea (clear) present. No sinus tenderness or nasal deformity. No epistaxis.  No foreign bodies.  Mouth/Throat: Oropharynx is clear and moist. No oropharyngeal exudate.  Eyes: Conjunctivae are normal.  Pupils are equal, round, and reactive to light. Right eye exhibits no discharge. Left eye exhibits no discharge. No scleral icterus.  Neck: Normal range of motion. Neck supple. No thyromegaly present.  Cardiovascular: Regular rhythm, normal heart sounds and intact distal pulses.  Exam reveals no gallop and no friction rub.   No murmur heard. Pulmonary/Chest: Effort normal and breath sounds normal. No stridor. No respiratory distress. She has no wheezes. She has no rales. She exhibits no tenderness.  Abdominal: Soft. Bowel sounds are normal. There is no tenderness.  Lymphadenopathy:    She has no cervical adenopathy.  Neurological: She is alert and oriented to person, place, and time.  Skin: Skin is warm and dry. No rash noted. She is not diaphoretic. No erythema.      Assessment & Plan:   1. Rhinosinusitis Likely viral in etiology, Rx Atrovent and Mucinex, supportive care advised, return to clinic if symptoms worsen, fail to resolve or as needed - Guaifenesin (MUCINEX MAXIMUM STRENGTH) 1200 MG TB12; Take 1 tablet (1,200 mg total) by mouth every 12 (twelve) hours as needed.  Dispense: 14 tablet; Refill: 1 - ipratropium (ATROVENT) 0.03 % nasal spray; Place 2 sprays into both nostrils 2 (two) times daily.  Dispense: 30 mL; Refill: 1  Jaynee Eagles, PA-C [facility] [phone] 09/14/2014 9:46 AM

## 2014-09-14 NOTE — Progress Notes (Signed)
I was directly involved with the patient's care and agree with the physical, diagnosis and treatment plan.  

## 2014-09-14 NOTE — Patient Instructions (Signed)
Once your symptoms are under control, please stop the Atrovent nasal spray (you may resume it as needed), but continue the Flonase nasal spray (at least for the duration of your allergy season). Continue Allegra, Claritin or Zyrtec each day, as needed. You may also use saline nasal spray 3-4 times daily PRN until your symptoms resolve. Drink at least 64 ounces of water each day.  Change your clothes and take a shower after you've been outside, to remove the pollen from your hair and skin, and to reduce continued exposure to allergens which may lengthen the duration of your sinus cold.   Sinusitis Sinusitis is redness, soreness, and puffiness (inflammation) of the air pockets in the bones of your face (sinuses). The redness, soreness, and puffiness can cause air and mucus to get trapped in your sinuses. This can allow germs to grow and cause an infection.  HOME CARE   Drink enough fluids to keep your pee (urine) clear or pale yellow.  Use a humidifier in your home.  Run a hot shower to create steam in the bathroom. Sit in the bathroom with the door closed. Breathe in the steam 3-4 times a day.  Put a warm, moist washcloth on your face 3-4 times a day, or as told by your doctor.  Use salt water sprays (saline sprays) to wet the thick fluid in your nose. This can help the sinuses drain.  Only take medicine as told by your doctor. GET HELP RIGHT AWAY IF:   Your pain gets worse.  You have very bad headaches.  You are sick to your stomach (nauseous).  You throw up (vomit).  You are very sleepy (drowsy) all the time.  Your face is puffy (swollen).  Your vision changes.  You have a stiff neck.  You have trouble breathing. MAKE SURE YOU:   Understand these instructions.  Will watch your condition.  Will get help right away if you are not doing well or get worse. Document Released: 04/30/2008 Document Revised: 08/06/2012 Document Reviewed: 06/17/2012 [facility] Patient Information  2015 [place], [place]. This information is not intended to replace advice given to you by your health care provider. Make sure you discuss any questions you have with your health care provider.

## 2014-09-26 ENCOUNTER — Ambulatory Visit (INDEPENDENT_AMBULATORY_CARE_PROVIDER_SITE_OTHER): Payer: BC Managed Care – PPO | Admitting: Family Medicine

## 2014-09-26 VITALS — BP 128/86 | HR 93 | Temp 98.1°F | Resp 16 | Ht 62.0 in | Wt 171.0 lb

## 2014-09-26 DIAGNOSIS — J329 Chronic sinusitis, unspecified: Secondary | ICD-10-CM

## 2014-09-26 DIAGNOSIS — J019 Acute sinusitis, unspecified: Secondary | ICD-10-CM

## 2014-09-26 DIAGNOSIS — B9689 Other specified bacterial agents as the cause of diseases classified elsewhere: Secondary | ICD-10-CM

## 2014-09-26 MED ORDER — GUAIFENESIN ER 1200 MG PO TB12: 1.0000 | ORAL_TABLET | Freq: Two times a day (BID) | ORAL | Status: DC | PRN

## 2014-09-26 MED ORDER — AMOXICILLIN 500 MG PO CAPS: 1000.0000 mg | ORAL_CAPSULE | Freq: Two times a day (BID) | ORAL | Status: DC

## 2014-09-26 NOTE — Progress Notes (Signed)
Subjective:    Patient ID: Ruth Graham, female    DOB: [birth year], 23 y.o.   MRN: [id number]  HPI Patient presents for follow up for sinusitis and was last seen here on 09/14/14. Initial visit was experiencing sinus pain, headache, ear pressure, rhinorrhea, and cough. She was compliant with mucinex, zyrtec, flonase, and nasal atrovent and only had one day of improvement before getting a little worse. Now has chest/nasal congestion, sinus pressure/headache, rhinorrhea, productive cough with yellow and green mucus, and CP secondary to cough. Denies fever, sore throat, sneezing, or wheezing. Sick contact 2 weeks ago, has seasonal allergies, and no h/o asthma. Now ill for 15 days.   Review of Systems  Constitutional: Positive for fatigue. Negative for fever, chills and appetite change.  HENT: Positive for congestion, rhinorrhea and sinus pressure. Negative for ear discharge, ear pain, postnasal drip, sneezing, sore throat and tinnitus.   Eyes: Negative for pain, discharge and itching.  Respiratory: Positive for cough (productive). Negative for chest tightness, shortness of breath and wheezing.   Cardiovascular: Positive for chest pain (secondary to cough). Negative for palpitations.  Gastrointestinal: Negative for nausea, vomiting and abdominal pain.  Musculoskeletal: Negative for myalgias, back pain, neck pain and neck stiffness.  Skin: Negative for rash.  Allergic/Immunologic: Negative for environmental allergies and food allergies.  Neurological: Negative for dizziness, light-headedness and headaches.  Hematological: Negative for adenopathy.       Objective:   Physical Exam  Constitutional: She is oriented to person, place, and time. She appears well-developed and well-nourished. No distress.  Blood pressure 128/86, pulse 93, temperature 98.1 F (36.7 C), temperature source Oral, resp. rate 16, height 5\' 2"  (1.575 m), weight 171 lb (77.565 kg), last menstrual period 09/19/2014, SpO2 99  %.   HENT:  Head: Normocephalic and atraumatic.  Right Ear: Tympanic membrane, external ear and ear canal normal. Tympanic membrane is not erythematous and not bulging. Right ear middle ear effusion: serous with bubbles.  Left Ear: Tympanic membrane, external ear and ear canal normal. Tympanic membrane is not erythematous and not bulging. Left ear middle ear effusion: serous.  Nose: Rhinorrhea present. No mucosal edema or sinus tenderness. Right sinus exhibits no maxillary sinus tenderness and no frontal sinus tenderness. Left sinus exhibits no maxillary sinus tenderness and no frontal sinus tenderness.  Mouth/Throat: Uvula is midline. Posterior oropharyngeal erythema (mild) present. No oropharyngeal exudate or posterior oropharyngeal edema.  Eyes: Conjunctivae are normal. Pupils are equal, round, and reactive to light. Right eye exhibits discharge (watery). Left eye exhibits discharge (watery). No scleral icterus.  Neck: Normal range of motion. Neck supple.  Cardiovascular: Normal rate, regular rhythm and normal heart sounds.  Exam reveals no gallop and no friction rub.   No murmur heard. Pulmonary/Chest: Effort normal and breath sounds normal. No respiratory distress. She has no decreased breath sounds. She has no wheezes. She has no rhonchi. She has no rales. She exhibits no tenderness.  Abdominal: Soft. Bowel sounds are normal. There is no tenderness.  Lymphadenopathy:    She has no cervical adenopathy.  Neurological: She is alert and oriented to person, place, and time.  Skin: Skin is warm and dry. No rash noted. She is not diaphoretic. No erythema. No pallor.        Assessment & Plan:  1. Rhinosinusitis 2. Acute bacterial rhinosinusitis - Guaifenesin (MUCINEX MAXIMUM STRENGTH) 1200 MG TB12; Take 1 tablet (1,200 mg total) by mouth every 12 (twelve) hours as needed.  Dispense: 14 tablet; Refill: 1 - amoxicillin (  AMOXIL) 500 MG capsule; Take 2 capsules (1,000 mg total) by mouth 2 (two)  times daily.  Dispense: 40 capsule; Refill: 0 - Plenty of fluid and rest.    Alveta Heimlich PA-C  [facility] 09/26/2014 5:55 PM

## 2014-09-30 NOTE — Progress Notes (Signed)
History and physical examinations reviewed with Tishira Brewington, PA-C.  Agree with assessment and plan.

## 2016-10-13 ENCOUNTER — Ambulatory Visit (INDEPENDENT_AMBULATORY_CARE_PROVIDER_SITE_OTHER): Payer: BLUE CROSS/BLUE SHIELD | Admitting: Physician Assistant

## 2016-10-13 VITALS — BP 130/76 | HR 78 | Temp 98.5°F | Resp 18 | Ht 63.0 in | Wt 172.0 lb

## 2016-10-13 DIAGNOSIS — B373 Candidiasis of vulva and vagina: Secondary | ICD-10-CM | POA: Diagnosis not present

## 2016-10-13 DIAGNOSIS — L298 Other pruritus: Secondary | ICD-10-CM | POA: Diagnosis not present

## 2016-10-13 DIAGNOSIS — N898 Other specified noninflammatory disorders of vagina: Secondary | ICD-10-CM

## 2016-10-13 DIAGNOSIS — B3731 Acute candidiasis of vulva and vagina: Secondary | ICD-10-CM

## 2016-10-13 LAB — POCT URINALYSIS DIP (MANUAL ENTRY)
BILIRUBIN UA: NEGATIVE
Bilirubin, UA: NEGATIVE
Glucose, UA: NEGATIVE
LEUKOCYTES UA: NEGATIVE
Nitrite, UA: NEGATIVE
PH UA: 5.5
PROTEIN UA: NEGATIVE
RBC UA: NEGATIVE
SPEC GRAV UA: 1.025
Urobilinogen, UA: 0.2

## 2016-10-13 LAB — POCT WET + KOH PREP: Trich by wet prep: ABSENT

## 2016-10-13 LAB — POC MICROSCOPIC URINALYSIS (UMFC): Mucus: ABSENT

## 2016-10-13 MED ORDER — FLUCONAZOLE 150 MG PO TABS: 150.0000 mg | ORAL_TABLET | Freq: Once | ORAL | 0 refills | Status: AC

## 2016-10-13 NOTE — Patient Instructions (Addendum)
Take pill and this should relieve your symptoms. If your symptoms still persist after this medication, follow up.    Vaginal Yeast infection, Adult Vaginal yeast infection is a condition that causes soreness, swelling, and redness (inflammation) of the vagina. It also causes vaginal discharge. This is a common condition. Some women get this infection frequently. What are the causes? This condition is caused by a change in the normal balance of the yeast (candida) and bacteria that live in the vagina. This change causes an overgrowth of yeast, which causes the inflammation. What increases the risk? This condition is more likely to develop in:  Women who take antibiotic medicines.  Women who have diabetes.  Women who take birth control pills.  Women who are pregnant.  Women who douche often.  Women who have a weak defense (immune) system.  Women who have been taking steroid medicines for a long time.  Women who frequently wear tight clothing. What are the signs or symptoms? Symptoms of this condition include:  White, thick vaginal discharge.  Swelling, itching, redness, and irritation of the vagina. The lips of the vagina (vulva) may be affected as well.  Pain or a burning feeling while urinating.  Pain during sex. How is this diagnosed? This condition is diagnosed with a medical history and physical exam. This will include a pelvic exam. Your health care provider will examine a sample of your vaginal discharge under a microscope. Your health care provider may send this sample for testing to confirm the diagnosis. How is this treated? This condition is treated with medicine. Medicines may be over-the-counter or prescription. You may be told to use one or more of the following:  Medicine that is taken orally.  Medicine that is applied as a cream.  Medicine that is inserted directly into the vagina (suppository). Follow these instructions at home:  Take or apply  over-the-counter and prescription medicines only as told by your health care provider.  Do not have sex until your health care provider has approved. Tell your sex partner that you have a yeast infection. That person should go to his or her health care provider if he or she develops symptoms.  Do not wear tight clothes, such as pantyhose or tight pants.  Avoid using tampons until your health care provider approves.  Eat more yogurt. This may help to keep your yeast infection from returning.  Try taking a sitz bath to help with discomfort. This is a warm water bath that is taken while you are sitting down. The water should only come up to your hips and should cover your buttocks. Do this 3-4 times per day or as told by your health care provider.  Do not douche.  Wear breathable, cotton underwear.  If you have diabetes, keep your blood sugar levels under control. Contact a health care provider if:  You have a fever.  Your symptoms go away and then return.  Your symptoms do not get better with treatment.  Your symptoms get worse.  You have new symptoms.  You develop blisters in or around your vagina.  You have blood coming from your vagina and it is not your menstrual period.  You develop pain in your abdomen. This information is not intended to replace advice given to you by your health care provider. Make sure you discuss any questions you have with your health care provider. Document Released: 08/22/2005 Document Revised: 04/25/2016 Document Reviewed: 05/16/2015 Elsevier Interactive Patient Education  2017 [employer].   IF  you received an x-ray today, you will receive an invoice from [facility] Radiology. Please contact [facility] at [phone] with questions or concerns regarding your invoice.   IF you received labwork today, you will receive an invoice from [employer]. Please contact [facility] at [phone] with questions or  concerns regarding your invoice.   Our billing staff will not be able to assist you with questions regarding bills from these companies.  You will be contacted with the lab results as soon as they are available. The fastest way to get your results is to activate your My Chart account. Instructions are located on the last page of this paperwork. If you have not heard from [place] regarding the results in 2 weeks, please contact this office.

## 2016-10-13 NOTE — Progress Notes (Signed)
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  SUBJECTIVE  Ruth Graham is a 25 y.o. female who complains of abdominal pain, cloudy malordorous urine, vaginal discharge and vaginal itching x 9 days. She denies dysuria, hematuria, urinary frequency, urinary urgency, flank pain, pelvic pain and genital rash. Has tried nothing for relief. Pt is sexually active but has not had sex in one year.   Of note, pt saw gynecologist at [facility] in 03/2016. 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 Pap was normal.  She  has a past medical history of Allergy; Angioedema (05/04/2013); and HSV-2 (herpes simplex virus 2) infection.    Medications reviewed and updated by myself where necessary, and exist elsewhere in the encounter.   Ms. Bertelli has No Known Allergies. She  reports that she has never smoked. She has never used smokeless tobacco. She reports that she drinks alcohol. She reports that she does not use drugs. She  reports that she currently engages in sexual activity and has had female partners. She reports using the following method of birth control/protection: Condom. The patient  has a past surgical history that includes Tonsillectomy.  Her family history includes Asthma in her brother; Diabetes in her father and mother; Hypertension in her father and mother.  Review of Systems  Constitutional: Negative for chills, diaphoresis and fever.  Gastrointestinal: Negative for nausea and vomiting.    OBJECTIVE  Her  height is 5\' 3"  (1.6 m) and weight is 172 lb (78 kg). Her oral temperature is 98.5 F (36.9 C). Her blood pressure is 130/76 and her pulse is 78. Her respiration is 18 and oxygen saturation is 99%.  The patient's body mass index is 30.47 kg/m.  Physical Exam  Constitutional: She is oriented to person, place, and time. She appears  well-developed and well-nourished.  HENT:  Head: Normocephalic and atraumatic.  Eyes: Conjunctivae are normal.  Neck: Normal range of motion.  Respiratory: Effort normal.  Genitourinary: Uterus is enlarged. Uterus is not tender. Cervix exhibits no motion tenderness. Right adnexum displays no mass and no tenderness. Left adnexum displays no mass and no tenderness. Vaginal discharge (curdy white discharge in vaginal canal) found.  Neurological: She is alert and oriented to person, place, and time.  Skin: Skin is warm and dry.  Psychiatric: She has a normal mood and affect.    Results for orders placed or performed in visit on 10/13/16 (from the past 24 hour(s))  POCT urinalysis dipstick     Status: None   Collection Time: 10/13/16  8:52 AM  Result Value Ref Range   Color, UA yellow yellow   Clarity, UA clear clear   Glucose, UA negative negative   Bilirubin, UA negative negative   Ketones, POC UA negative negative   Spec Grav, UA 1.025    Blood, UA negative negative   pH, UA 5.5    Protein Ur, POC negative negative   Urobilinogen, UA 0.2    Nitrite, UA Negative Negative   Leukocytes, UA Negative Negative  POCT Wet + KOH Prep     Status: Abnormal   Collection Time: 10/13/16  8:52 AM  Result Value Ref Range   Yeast by KOH Present Present, Absent   Yeast by wet prep Present Present, Absent   WBC by wet prep Moderate (A) None, Few, Too numerous to count  Clue Cells Wet Prep HPF POC Few (A) None, Too numerous to count   Trich by wet prep Absent Present, Absent   Bacteria Wet Prep HPF POC Few None, Few, Too numerous to count   Epithelial Cells By [employer] Pref ([facility]) Moderate (A) None, Few, Too numerous to count   RBC,UR,HPF,POC None None RBC/hpf  POCT Microscopic Urinalysis ([facility])     Status: Abnormal   Collection Time: 10/13/16  9:02 AM  Result Value Ref Range   WBC,UR,HPF,POC None None WBC/hpf   RBC,UR,HPF,POC None None RBC/hpf   Bacteria Few (A) None, Too numerous to count    Mucus Absent Absent   Epithelial Cells, UR Per Microscopy Few (A) None, Too numerous to count cells/hpf    ASSESSMENT & PLAN  Undine was seen today for vaginitis.  Diagnoses and all orders for this visit:  Vaginal itching -     POCT Microscopic Urinalysis ([facility]) -     POCT urinalysis dipstick -     POCT Wet + KOH Prep  Vaginal candidiasis -     fluconazole (DIFLUCAN) 150 MG tablet; Take 1 tablet (150 mg total) by mouth once.   The patient was advised to call or come back to clinic if she does not see an improvement in symptoms, or worsens with the above plan.   Tenna Delaine, PA-C [facility] 10/13/2016 6:14 PM

## 2017-04-14 ENCOUNTER — Ambulatory Visit (HOSPITAL_COMMUNITY)
Admission: EM | Admit: 2017-04-14 | Discharge: 2017-04-14 | Disposition: A | Discharge status: Home / Self Care | Payer: BLUE CROSS/BLUE SHIELD | Attending: Family Medicine | Admitting: Family Medicine

## 2017-04-14 ENCOUNTER — Encounter (HOSPITAL_COMMUNITY): Payer: Self-pay | Admitting: *Deleted

## 2017-04-14 DIAGNOSIS — N760 Acute vaginitis: Secondary | ICD-10-CM

## 2017-04-14 DIAGNOSIS — N898 Other specified noninflammatory disorders of vagina: Secondary | ICD-10-CM | POA: Diagnosis not present

## 2017-04-14 DIAGNOSIS — R1909 Other intra-abdominal and pelvic swelling, mass and lump: Secondary | ICD-10-CM | POA: Diagnosis not present

## 2017-04-14 NOTE — Discharge Instructions (Signed)
It was nice seeing you today. We collected vaginal specimen for testing. I will contact you soon with result. Please see your OB/GYN or PCP soon for a pelvic ultrasound of the mass I felt on your abdomen.

## 2017-04-14 NOTE — ED Provider Notes (Signed)
[facility]    CSN: [id number] Arrival date & time: 04/14/17  1702     History   Chief Complaint Chief Complaint  Patient presents with  . Vaginal Discharge    HPI Ruth Graham is a 26 y.o. female.   The history is provided by the patient. No language interpreter was used.  Vaginal Discharge  Quality:  White (vaginal discharge x 1 week. Whitish with odor to it) Severity:  Mild Onset quality:  Gradual Duration:  1 week Timing:  Constant Progression:  Unchanged Context: not after intercourse and not during intercourse   Relieved by:  Nothing Worsened by:  Nothing Ineffective treatments:  None tried Associated symptoms: no abdominal pain, no dysuria, no fever, no genital lesions, no nausea, no vaginal itching and no vomiting   Risk factors: no new sexual partner and no STI exposure   Risk factors comment:  Chlamydia 2 yrs ago. She has been sexually active with current partner for about 4 months. LMP was 1-2 weeks ago. Regular.   Past Medical History:  Diagnosis Date  . Allergy   . Angioedema 05/04/2013  . HSV-2 (herpes simplex virus 2) infection     Patient Active Problem List   Diagnosis Date Noted  . HSV-2 (herpes simplex virus 2) infection     Past Surgical History:  Procedure Laterality Date  . TONSILLECTOMY      OB History    No data available       Home Medications    Prior to Admission medications   Medication Sig Start Date End Date Taking? Authorizing Provider  CETIRIZINE HCL PO Take by mouth.   Yes [provider]    Family History Family History  Problem Relation Age of Onset  . Diabetes Mother   . Hypertension Mother   . Diabetes Father   . Hypertension Father   . Asthma Brother     Social History Social History  Substance Use Topics  . Smoking status: Never Smoker  . Smokeless tobacco: Never Used  . Alcohol use Yes     Comment: occasionally     Allergies   Patient has no known allergies.   Review of  Systems Review of Systems  Constitutional: Negative for fever.  Respiratory: Negative.   Cardiovascular: Negative.   Gastrointestinal: Negative for abdominal pain, nausea and vomiting.  Genitourinary: Positive for vaginal discharge. Negative for dysuria.  All other systems reviewed and are negative.    Physical Exam Triage Vital Signs ED Triage Vitals [04/14/17 1749]  Enc Vitals Group     BP (!) 141/71     Pulse Rate 87     Resp 16     Temp 98.7 F (37.1 C)     Temp src      SpO2 100 %     Weight      Height      Head Circumference      Peak Flow      Pain Score      Pain Loc      Pain Edu?      Excl. in [place]?    No data found.   Updated Vital Signs BP (!) 141/71   Pulse 87   Temp 98.7 F (37.1 C)   Resp 16   LMP 03/31/2017 (Approximate)   SpO2 100%   Visual Acuity Right Eye Distance:   Left Eye Distance:   Bilateral Distance:    Right Eye Near:   Left Eye Near:  Bilateral Near:     Physical Exam  Constitutional: She appears well-developed and well-nourished.  Cardiovascular: Normal rate, regular rhythm and normal heart sounds.   No murmur heard. Pulmonary/Chest: Effort normal and breath sounds normal. No respiratory distress. She has no wheezes.  Abdominal: Soft. Bowel sounds are normal. She exhibits mass. She exhibits no distension. There is no tenderness.  Suprapubic firm mass. She stated she has giant fibroid. She affirmed that her LMP was 2 weeks ago  Genitourinary: Pelvic exam was performed with patient supine. Uterus is enlarged. Uterus is not tender. Cervix exhibits no motion tenderness. Right adnexum displays no mass and no tenderness. Left adnexum displays no mass and no tenderness. Vaginal discharge found.  Nursing note and vitals reviewed.    UC Treatments / Results  Labs (all labs ordered are listed, but only abnormal results are displayed) Labs Reviewed - No data to display  EKG  EKG Interpretation None       Radiology No  results found.  Procedures Procedures (including critical care time)  Medications Ordered in UC Medications - No data to display   Initial Impression / Assessment and Plan / UC Course  I have reviewed the triage vital signs and the nursing notes.  Pertinent labs & imaging results that were available during my care of the patient were reviewed by me and considered in my medical decision making (see chart for details).  Clinical Course as of Apr 15 1855  Sun Apr 14, 2017  0174 Suprapubic mass  Non-tender. Patient stated his her fibroid. I reviewed her record, she has a documentation of grape size fibroid. I recommended upreg today but she declined. She is advised f/u with her PCP for mass reassessment and pelvic U/S. F/U as needed.  [KE]  1855 Pelvic exam completed, Wet prep completed  GC/Chlamydia tested. I will contact her with result. No STD exposure hence no treatment required at this time pending result.  [KE]    Clinical Course User Index [KE] Kinnie Feil, MD      Final Clinical Impressions(s) / UC Diagnoses   Final diagnoses:  None    New Prescriptions New Prescriptions   No medications on file     Kinnie Feil, MD 04/14/17 [phone]

## 2017-04-14 NOTE — ED Triage Notes (Signed)
C/O malodorous vaginal discharge x 1 wk.  Denies abd pain.

## 2017-04-15 ENCOUNTER — Telehealth (HOSPITAL_COMMUNITY): Payer: Self-pay | Admitting: Internal Medicine

## 2017-04-15 LAB — CERVICOVAGINAL ANCILLARY ONLY
BACTERIAL VAGINITIS: POSITIVE — AB
Candida vaginitis: NEGATIVE
Chlamydia: NEGATIVE
Neisseria Gonorrhea: NEGATIVE
Trichomonas: NEGATIVE

## 2017-04-15 NOTE — Telephone Encounter (Signed)
Please let patient know that test for gardnerella (bacterial vaginosis) was positive.  This only needs to be treated if there are symptoms, such as vaginal irritation/discharge.  If these symptoms are present, ok to send rx for metronidazole 500mg  bid x 7d #14 no refills or metronidazole vaginal gel 4.69% 1 applicatorful bid x 7d #50 no refills. Recheck for further evaluation if symptoms are not improving.  LM

## 2017-04-16 ENCOUNTER — Telehealth: Payer: Self-pay | Admitting: General Practice

## 2017-04-16 ENCOUNTER — Telehealth: Payer: Self-pay | Admitting: Family Medicine

## 2017-04-16 MED ORDER — METRONIDAZOLE 500 MG PO TABS: 500.0000 mg | ORAL_TABLET | Freq: Two times a day (BID) | ORAL | 0 refills | Status: AC

## 2017-04-16 NOTE — Telephone Encounter (Signed)
I called the pharmacy to cancel Metronidazole E-scribed to [facility]. I was informed by the pharmacist that patient called to transfer the prescription to  [facility].

## 2017-04-16 NOTE — Telephone Encounter (Signed)
I called patient to discuss positive BV.  I was unable to reach her. HIPPA compliant message left for her to call Urgent Care back.   Note: She came in with vaginitis symptoms. I will go ahead and E-scribe Metronidazole. Please advise her to pick up med when she calls back.

## 2017-04-16 NOTE — Telephone Encounter (Signed)
Pt was seen at the UC over the weekend. She was prescribe Flagyl today. We sent this to the pharmacy on her file. She needs this actually sent to [facility] on [address] jw

## 2017-09-13 ENCOUNTER — Ambulatory Visit (INDEPENDENT_AMBULATORY_CARE_PROVIDER_SITE_OTHER): Payer: BLUE CROSS/BLUE SHIELD | Admitting: Physician Assistant

## 2017-09-13 ENCOUNTER — Encounter: Payer: Self-pay | Admitting: Physician Assistant

## 2017-09-13 VITALS — BP 130/82 | HR 96 | Temp 99.1°F | Resp 18 | Ht 63.0 in | Wt 183.6 lb

## 2017-09-13 DIAGNOSIS — B3731 Acute candidiasis of vulva and vagina: Secondary | ICD-10-CM

## 2017-09-13 DIAGNOSIS — N898 Other specified noninflammatory disorders of vagina: Secondary | ICD-10-CM | POA: Diagnosis not present

## 2017-09-13 DIAGNOSIS — B373 Candidiasis of vulva and vagina: Secondary | ICD-10-CM | POA: Diagnosis not present

## 2017-09-13 DIAGNOSIS — R300 Dysuria: Secondary | ICD-10-CM | POA: Diagnosis not present

## 2017-09-13 DIAGNOSIS — N3000 Acute cystitis without hematuria: Secondary | ICD-10-CM | POA: Diagnosis not present

## 2017-09-13 DIAGNOSIS — R82998 Other abnormal findings in urine: Secondary | ICD-10-CM

## 2017-09-13 DIAGNOSIS — D259 Leiomyoma of uterus, unspecified: Secondary | ICD-10-CM

## 2017-09-13 LAB — POCT URINALYSIS DIP (MANUAL ENTRY)
BILIRUBIN UA: NEGATIVE
GLUCOSE UA: NEGATIVE mg/dL
Ketones, POC UA: NEGATIVE mg/dL
NITRITE UA: NEGATIVE
Spec Grav, UA: >=1.03 — AB (ref 1.010–1.025)
UROBILINOGEN UA: 0.2 U/dL
pH, UA: 5.5 (ref 5.0–8.0)

## 2017-09-13 LAB — POCT WET + KOH PREP: TRICH BY WET PREP: ABSENT

## 2017-09-13 MED ORDER — FLUCONAZOLE 150 MG PO TABS: 150.0000 mg | ORAL_TABLET | Freq: Once | ORAL | 0 refills | Status: AC

## 2017-09-13 NOTE — Patient Instructions (Addendum)
     IF you received an x-ray today, you will receive an invoice from [facility] Radiology. Please contact [facility] Radiology at [phone] with questions or concerns regarding your invoice.   IF you received labwork today, you will receive an invoice from [facility]. Please contact [facility] at [phone] with questions or concerns regarding your invoice.   Our billing staff will not be able to assist you with questions regarding bills from these companies.  You will be contacted with the lab results as soon as they are available. The fastest way to get your results is to activate your My Chart account. Instructions are located on the last page of this paperwork. If you have not heard from [place] regarding the results in 2 weeks, please contact this office.    Treat your yeast infection - I will call you with your other lab results that are testing for urinae infection and vaginal infection.

## 2017-09-13 NOTE — Progress Notes (Addendum)
Ruth Graham  MRN: [id number] DOB: [birth year]  PCP: Patient, [name]  Chief Complaint  Patient presents with  . Vaginal Discharge    after period ended on this monday   . Vaginal Itching  . Dysuria    Subjective:  Pt presents to clinic for vaginal irritation for the last 5 days - it started after her menses stopped.  She is unsure it is urinary or vaginal - she has had both BV and yeast and is not sure which she might have.  She has used nothing OTC.  Currently sexual active without change in sex partners.  History is obtained by patient.  Review of Systems  Constitutional: Negative for chills and fever.  Gastrointestinal: Negative.  Negative for nausea.  Genitourinary: Positive for dysuria (beginning of the stream), frequency (intermittent) and vaginal discharge (and some mild itching'). Negative for urgency.  Musculoskeletal: Negative for back pain.    Patient Active Problem List   Diagnosis Date Noted  . Uterine fibroid 09/13/2017  . HSV-2 (herpes simplex virus 2) infection     No current outpatient prescriptions on file prior to visit.   No current facility-administered medications on file prior to visit.     No Known Allergies  Past Medical History:  Diagnosis Date  . Allergy   . Angioedema 05/04/2013  . HSV-2 (herpes simplex virus 2) infection    Social History   Social History Narrative   Lives with her parents and younger sister and older brother. Her other sister lives in [place].   Social History  Substance Use Topics  . Smoking status: Never Smoker  . Smokeless tobacco: Never Used  . Alcohol use Yes     Comment: occasionally   family history includes Asthma in her brother; Diabetes in her father and mother; Hypertension in her father and mother.     Objective:  BP 130/82   Pulse 96   Temp 99.1 F (37.3 C) (Oral)   Resp 18   Ht 5\' 3"  (1.6 m)   Wt 183 lb 9.6 oz (83.3 kg)   LMP 09/09/2017   SpO2 96%   BMI 32.52 kg/m  Body mass  index is 32.52 kg/m.  Physical Exam  Constitutional: She is oriented to person, place, and time and well-developed, well-nourished, and in no distress.  HENT:  Head: Normocephalic and atraumatic.  Right Ear: Hearing and external ear normal.  Left Ear: Hearing and external ear normal.  Eyes: Conjunctivae are normal.  Neck: Normal range of motion.  Cardiovascular: Normal rate, regular rhythm and normal heart sounds.   No murmur heard. Pulmonary/Chest: Effort normal and breath sounds normal. She has no wheezes.  Genitourinary: Cervix normal, right adnexa normal, left adnexa normal and vulva normal. Uterus is enlarged. Thick  curdy  odorless  white  yellow and vaginal discharge found.  Genitourinary Comments: 10 cm mass palpable in uterus - up to umbilicus  Neurological: She is alert and oriented to person, place, and time. Gait normal.  Skin: Skin is warm and dry.  Psychiatric: Mood, memory, affect and judgment normal.  Vitals reviewed.    Results for orders placed or performed in visit on 09/13/17  Urine Culture  Result Value Ref Range   Urine Culture, Routine Final report (A)    Organism ID, Bacteria Escherichia coli (A)    ORGANISM ID, BACTERIA Comment (A)    Antimicrobial Susceptibility Comment   GC/Chlamydia Probe Amp  Result Value Ref Range   Chlamydia trachomatis, NAA Negative Negative  Neisseria gonorrhoeae by PCR Negative Negative  POCT urinalysis dipstick  Result Value Ref Range   Color, UA yellow yellow   Clarity, UA cloudy (A) clear   Glucose, UA negative negative mg/dL   Bilirubin, UA negative negative   Ketones, POC UA negative negative mg/dL   Spec Grav, UA >=1.030 (A) 1.010 - 1.025   Blood, UA moderate (A) negative   pH, UA 5.5 5.0 - 8.0   Protein Ur, POC trace (A) negative mg/dL   Urobilinogen, UA 0.2 0.2 or 1.0 E.U./dL   Nitrite, UA Negative Negative   Leukocytes, UA Large (3+) (A) Negative  POCT Wet + KOH Prep  Result Value Ref Range   Yeast by KOH  Present (A) Absent   Yeast by wet prep Present (A) Absent   WBC by wet prep Few Few   Clue Cells Wet Prep HPF POC None None   Trich by wet prep Absent Absent   Bacteria Wet Prep HPF POC Moderate (A) Few   Epithelial Cells By [employer] Pref (UMFC) Moderate (A) None, Few, Too numerous to count   RBC,UR,HPF,POC None None RBC/hpf     Assessment and Plan :  Dysuria - Plan: POCT urinalysis dipstick  Vaginal discharge - Plan: POCT Wet + KOH Prep, GC/Chlamydia Probe Amp  Urine white blood cells increased - Plan: Urine Culture  Yeast vaginitis - Plan: fluconazole (DIFLUCAN) 150 MG tablet - this is consistent with the discharge seen on exam but the coloring is slightly yellow and with the leukocytes in urine will send genprobe. Send urine for culture but except it to be vaginal.  UTI - macrobid sent to [place] PA-C  [facility] 09/16/2017 11:46 AM

## 2017-09-15 LAB — URINE CULTURE

## 2017-09-16 LAB — GC/CHLAMYDIA PROBE AMP
CHLAMYDIA, DNA PROBE: NEGATIVE
Neisseria gonorrhoeae by PCR: NEGATIVE

## 2017-09-16 MED ORDER — NITROFURANTOIN MONOHYD MACRO 100 MG PO CAPS
100.0000 mg | ORAL_CAPSULE | Freq: Two times a day (BID) | ORAL | 0 refills | Status: AC
Start: 2017-09-16 — End: 2017-09-21

## 2017-09-16 NOTE — Addendum Note (Signed)
Addended by: Mancel Bale on: 09/16/2017 11:46 AM   Modules accepted: Orders

## 2017-10-18 ENCOUNTER — Other Ambulatory Visit: Payer: Self-pay | Admitting: Physician Assistant

## 2017-10-18 DIAGNOSIS — B373 Candidiasis of vulva and vagina: Secondary | ICD-10-CM

## 2017-10-18 DIAGNOSIS — B3731 Acute candidiasis of vulva and vagina: Secondary | ICD-10-CM

## 2017-10-18 NOTE — Telephone Encounter (Signed)
Do you want to fill this? 

## 2017-10-18 NOTE — Telephone Encounter (Signed)
Copied from [place]. Topic: Inquiry >> Oct 18, 2017  9:57 AM Oliver Pila B wrote: Reason for CRM: pt called to see if she can get her Rx that was requested during her last visit of Diflucan for her yeast infection, she has been told to contact pharmacy to see if it can be filled or have a request sent back to the practice

## 2017-10-19 ENCOUNTER — Other Ambulatory Visit: Payer: Self-pay

## 2017-10-19 ENCOUNTER — Ambulatory Visit: Payer: BLUE CROSS/BLUE SHIELD | Admitting: Physician Assistant

## 2017-10-19 VITALS — BP 122/88 | HR 94 | Temp 98.9°F | Resp 17 | Ht 63.0 in | Wt 194.4 lb

## 2017-10-19 DIAGNOSIS — B373 Candidiasis of vulva and vagina: Secondary | ICD-10-CM | POA: Diagnosis not present

## 2017-10-19 DIAGNOSIS — Z23 Encounter for immunization: Secondary | ICD-10-CM

## 2017-10-19 DIAGNOSIS — Z202 Contact with and (suspected) exposure to infections with a predominantly sexual mode of transmission: Secondary | ICD-10-CM | POA: Diagnosis not present

## 2017-10-19 DIAGNOSIS — R300 Dysuria: Secondary | ICD-10-CM

## 2017-10-19 DIAGNOSIS — N760 Acute vaginitis: Secondary | ICD-10-CM | POA: Diagnosis not present

## 2017-10-19 DIAGNOSIS — Z309 Encounter for contraceptive management, unspecified: Secondary | ICD-10-CM

## 2017-10-19 DIAGNOSIS — B9689 Other specified bacterial agents as the cause of diseases classified elsewhere: Secondary | ICD-10-CM | POA: Diagnosis not present

## 2017-10-19 DIAGNOSIS — B3731 Acute candidiasis of vulva and vagina: Secondary | ICD-10-CM

## 2017-10-19 DIAGNOSIS — N898 Other specified noninflammatory disorders of vagina: Secondary | ICD-10-CM

## 2017-10-19 LAB — POCT WET + KOH PREP
Trich by wet prep: ABSENT
Yeast by wet prep: ABSENT

## 2017-10-19 LAB — POCT URINALYSIS DIP (MANUAL ENTRY)
Bilirubin, UA: NEGATIVE
Glucose, UA: NEGATIVE mg/dL
Ketones, POC UA: NEGATIVE mg/dL
Nitrite, UA: NEGATIVE
Protein Ur, POC: NEGATIVE mg/dL
Spec Grav, UA: <=1.005 — AB (ref 1.010–1.025)
Urobilinogen, UA: 0.2 U/dL
pH, UA: 6 (ref 5.0–8.0)

## 2017-10-19 LAB — POCT URINE PREGNANCY: Preg Test, Ur: NEGATIVE

## 2017-10-19 MED ORDER — METRONIDAZOLE 500 MG PO TABS: 500.0000 mg | ORAL_TABLET | Freq: Two times a day (BID) | ORAL | 0 refills | Status: DC

## 2017-10-19 MED ORDER — NORETHIN ACE-ETH ESTRAD-FE 1.5-30 MG-MCG PO TABS: 1.0000 | ORAL_TABLET | Freq: Every day | ORAL | 12 refills | Status: DC

## 2017-10-19 MED ORDER — FLUCONAZOLE 150 MG PO TABS: 150.0000 mg | ORAL_TABLET | Freq: Once | ORAL | 0 refills | Status: AC

## 2017-10-19 NOTE — Progress Notes (Signed)
Ruth Graham  MRN: [id number] DOB: 10-23-1991  PCP: Patient, [name]  Subjective:  Pt is a 26 year old female who presents to clinic for vaginal discomfort x 3 days. Endorses cottage cheese discharge, white in color, itchy. She wants preg test.  LMP Nov 5. She took plan B two weeks ago. She is using condoms  She is not on birth control. She has Rx for OCPs from her GYN but has not taken it at all. PAP 02/2017 - negative.   Review of Systems  Constitutional: Negative for chills, fatigue and fever.  Respiratory: Negative for cough, shortness of breath and wheezing.   Cardiovascular: Negative for chest pain and palpitations.  Gastrointestinal: Negative for abdominal pain, diarrhea, nausea and vomiting.  Genitourinary: Positive for dysuria, vaginal discharge and vaginal pain (itching/discomfort). Negative for decreased urine volume, difficulty urinating, enuresis, flank pain, frequency, hematuria and urgency.  Musculoskeletal: Negative for back pain.    Patient Active Problem List   Diagnosis Date Noted  . Uterine fibroid 09/13/2017  . HSV-2 (herpes simplex virus 2) infection     Current Outpatient Medications on File Prior to Visit  Medication Sig Dispense Refill  . cetirizine (ZYRTEC) 10 MG tablet Take 10 mg by mouth daily.     No current facility-administered medications on file prior to visit.     No Known Allergies   Objective:  BP 122/88 (BP Location: Left Arm, Patient Position: Sitting, Cuff Size: Normal)   Pulse 94   Temp 98.9 F (37.2 C) (Oral)   Resp 17   Ht 5\' 3"  (1.6 m)   Wt 194 lb 6.4 oz (88.2 kg)   LMP 09/30/2017 Comment: back on menses on 10/11/17 after taking Plan B  SpO2 99%   BMI 34.44 kg/m   Physical Exam  Constitutional: She is oriented to person, place, and time and well-developed, well-nourished, and in no distress. No distress.  Genitourinary: Cervix normal, right adnexa normal, left adnexa normal and vulva normal. Thick  curdy  white  brown  and vaginal discharge found.  Neurological: She is alert and oriented to person, place, and time. GCS score is 15.  Skin: Skin is warm and dry.  Psychiatric: Mood, memory, affect and judgment normal.  Vitals reviewed.   Results for orders placed or performed in visit on 10/19/17  POCT urinalysis dipstick  Result Value Ref Range   Color, UA yellow yellow   Clarity, UA clear clear   Glucose, UA negative negative mg/dL   Bilirubin, UA negative negative   Ketones, POC UA negative negative mg/dL   Spec Grav, UA <=1.005 (A) 1.010 - 1.025   Blood, UA moderate (A) negative   pH, UA 6.0 5.0 - 8.0   Protein Ur, POC negative negative mg/dL   Urobilinogen, UA 0.2 0.2 or 1.0 E.U./dL   Nitrite, UA Negative Negative   Leukocytes, UA Large (3+) (A) Negative  POCT urine pregnancy  Result Value Ref Range   Preg Test, Ur Negative Negative  POCT Wet + KOH Prep  Result Value Ref Range   Yeast by KOH Present (A) Absent   Yeast by wet prep Absent Absent   WBC by wet prep Moderate (A) Few   Clue Cells Wet Prep HPF POC Few (A) None   Trich by wet prep Absent Absent   Bacteria Wet Prep HPF POC None (A) Few   Epithelial Cells By [employer] Pref (UMFC) Moderate (A) None, Few, Too numerous to count   RBC,UR,HPF,POC None None RBC/hpf  Assessment and Plan :  1. Discharge of vagina 2. Dysuria - POCT Wet + KOH Prep - POCT urinalysis dipstick - POCT urine pregnancy - Urine Culture - +leukocytes in UA - recently treated for UTI - will await culture to treat.  3. Possible exposure to STD - GC/Chlamydia Probe Amp - RPR - HIV antibody - Labs are pending, will contact with results. Safe sex practices encouraged.  4. Encounter for contraceptive management, unspecified type - norethindrone-ethinyl estradiol-iron (MICROGESTIN FE,GILDESS FE,LOESTRIN FE) 1.5-30 MG-MCG tablet; Take 1 tablet by mouth daily.  Dispense: 1 Package; Refill: 12 - Pt is inconsistent with taking OCPs. Discussed necessity to take these on  consistent basis. Also discussed other birth control options. She will think about it.  5. BV (bacterial vaginosis) - metroNIDAZOLE (FLAGYL) 500 MG tablet; Take 1 tablet (500 mg total) by mouth 2 (two) times daily with a meal. DO NOT CONSUME ALCOHOL WHILE TAKING THIS MEDICATION.  Dispense: 14 tablet; Refill: 0  6. Yeast vaginitis - fluconazole (DIFLUCAN) 150 MG tablet; Take 1 tablet (150 mg total) by mouth once for 1 dose. Repeat if needed  Dispense: 2 tablet; Refill: 0   Mercer Pod, PA-C  [facility] 10/19/2017 9:42 AM

## 2017-10-19 NOTE — Addendum Note (Signed)
Addended by: Leim Fabry on: 10/19/2017 10:57 AM   Modules accepted: Orders

## 2017-10-19 NOTE — Patient Instructions (Addendum)
You are positive for BV and yeast. Pregnancy test is negative.  Flagyl twice daily x 7 days for BV.  Diflucan once for yeast. May repeat if necessary.   Come back in 7-10 days if you are not better.   We will contact you with the rest of your lab results when they return. Continue to practice safe sex using condoms to prevent STDs. Take your birth control pill at the same time every day. If you cannot take it at the same time, consider using a different method. [facility] is a Microbiologist to learn about different options.   Thank you for coming in today. I hope you feel we met your needs.  Feel free to call PCP if you have any questions or further requests.  Please consider signing up for MyChart if you do not already have it, as this is a great way to communicate with me.  Best,  Whitney McVey, PA-C  IF you received an x-ray today, you will receive an invoice from [facility]. Please contact [facility] at [phone] with questions or concerns regarding your invoice.   IF you received labwork today, you will receive an invoice from [place]. Please contact [facility] at [phone] with questions or concerns regarding your invoice.   Our billing staff will not be able to assist you with questions regarding bills from these companies.  You will be contacted with the lab results as soon as they are available. The fastest way to get your results is to activate your My Chart account. Instructions are located on the last page of this paperwork. If you have not heard from [place] regarding the results in 2 weeks, please contact this office.

## 2017-10-20 LAB — HIV ANTIBODY (ROUTINE TESTING W REFLEX): HIV Screen 4th Generation wRfx: NONREACTIVE

## 2017-10-20 LAB — RPR: RPR Ser Ql: NONREACTIVE

## 2017-10-22 ENCOUNTER — Other Ambulatory Visit: Payer: Self-pay | Admitting: Physician Assistant

## 2017-10-22 DIAGNOSIS — N3000 Acute cystitis without hematuria: Secondary | ICD-10-CM

## 2017-10-22 LAB — URINE CULTURE

## 2017-10-22 MED ORDER — AMOXICILLIN 875 MG PO TABS: 875.0000 mg | ORAL_TABLET | Freq: Two times a day (BID) | ORAL | 0 refills | Status: AC

## 2017-10-22 NOTE — Progress Notes (Signed)
Please call pt - Her urine culture grew bacteria. Rx for antibiotic written and printed out. She can come pick it up at the front.  She is negative for syphilis and HIV

## 2017-10-22 NOTE — Telephone Encounter (Signed)
Seen for this problem 10/19/2017. Diagnosed with BV and treated appropriately.

## 2017-10-23 LAB — GC/CHLAMYDIA PROBE AMP
Chlamydia trachomatis, NAA: NEGATIVE
Neisseria gonorrhoeae by PCR: NEGATIVE

## 2017-10-23 NOTE — Progress Notes (Signed)
Please call pt and let her know she is negative for gonorrhea and chlamydia. Thank you!

## 2018-08-06 ENCOUNTER — Ambulatory Visit: Payer: BLUE CROSS/BLUE SHIELD | Admitting: Family Medicine

## 2018-08-06 ENCOUNTER — Other Ambulatory Visit: Payer: Self-pay

## 2018-08-06 VITALS — BP 132/88 | HR 74 | Temp 99.1°F | Resp 16 | Ht 63.0 in | Wt 194.8 lb

## 2018-08-06 DIAGNOSIS — N926 Irregular menstruation, unspecified: Secondary | ICD-10-CM

## 2018-08-06 DIAGNOSIS — Z113 Encounter for screening for infections with a predominantly sexual mode of transmission: Secondary | ICD-10-CM | POA: Diagnosis not present

## 2018-08-06 DIAGNOSIS — B9689 Other specified bacterial agents as the cause of diseases classified elsewhere: Secondary | ICD-10-CM

## 2018-08-06 DIAGNOSIS — R399 Unspecified symptoms and signs involving the genitourinary system: Secondary | ICD-10-CM

## 2018-08-06 DIAGNOSIS — N760 Acute vaginitis: Secondary | ICD-10-CM

## 2018-08-06 DIAGNOSIS — Z30011 Encounter for initial prescription of contraceptive pills: Secondary | ICD-10-CM

## 2018-08-06 LAB — POCT URINALYSIS DIP (MANUAL ENTRY)
Bilirubin, UA: NEGATIVE
Blood, UA: NEGATIVE
Glucose, UA: NEGATIVE mg/dL
LEUKOCYTES UA: NEGATIVE
NITRITE UA: NEGATIVE
PROTEIN UA: NEGATIVE mg/dL
Spec Grav, UA: 1.015 (ref 1.010–1.025)
UROBILINOGEN UA: 0.2 U/dL
pH, UA: 5.5 (ref 5.0–8.0)

## 2018-08-06 LAB — POCT WET + KOH PREP
TRICH BY WET PREP: ABSENT
YEAST BY KOH: ABSENT
YEAST BY WET PREP: ABSENT

## 2018-08-06 LAB — POCT URINE PREGNANCY: Preg Test, Ur: NEGATIVE

## 2018-08-06 MED ORDER — METRONIDAZOLE 500 MG PO TABS: 500.0000 mg | ORAL_TABLET | Freq: Two times a day (BID) | ORAL | 0 refills | Status: AC

## 2018-08-06 MED ORDER — METRONIDAZOLE 500 MG PO TABS: 500.0000 mg | ORAL_TABLET | Freq: Two times a day (BID) | ORAL | 0 refills | Status: DC

## 2018-08-06 MED ORDER — NORGESTIM-ETH ESTRAD TRIPHASIC 0.18/0.215/0.25 MG-25 MCG PO TABS: 1.0000 | ORAL_TABLET | Freq: Every day | ORAL | 11 refills | Status: DC

## 2018-08-06 NOTE — Progress Notes (Signed)
Chief Complaint  Patient presents with  . Exposure to STD    uti screening and pregnancy test  . Contraception    pt would like to restart birth control    HPI  Contraception Missed periods Patient was having bloating and upset stomach Patient's last menstrual period was 07/19/2018. She states that she wanted to get std testing They use condoms But she wanted to get testing because she is not sure if the condom broke   She reports that she would like to get on contraception She has a history of fibroid in the uterus She had a pap smear at her OB/GYN office She states that she would like to get on contraception She is interested in a pill for contraception She denies heavy menses, she denies migraines, denies smoking, denies liver disease or blood clot, no family history of breast cancer She is a G0P0   Past Medical History:  Diagnosis Date  . Allergy   . Angioedema 05/04/2013  . HSV-2 (herpes simplex virus 2) infection     Current Outpatient Medications  Medication Sig Dispense Refill  . cetirizine (ZYRTEC) 10 MG tablet Take 10 mg by mouth daily.    . metroNIDAZOLE (FLAGYL) 500 MG tablet Take 1 tablet (500 mg total) by mouth 2 (two) times daily for 7 days. DO NOT CONSUME ALCOHOL WHILE TAKING THIS MEDICATION. 14 tablet 0  . Norgestimate-Ethinyl Estradiol Triphasic (ORTHO TRI-CYCLEN LO) 0.18/0.215/0.25 MG-25 MCG tab Take 1 tablet by mouth daily. 1 Package 11   No current facility-administered medications for this visit.     Allergies: No Known Allergies  Past Surgical History:  Procedure Laterality Date  . TONSILLECTOMY      Social History   Socioeconomic History  . Marital status: Single    Spouse name: n/a  . Number of children: 0  . Years of education: 12+  . Highest education level: Not on file  Occupational History  . Occupation: overnight stocker    Comment: [employer]  Social Needs  . Financial resource strain: Not on file  . Food insecurity:   Worry: Not on file    Inability: Not on file  . Transportation needs:    Medical: Not on file    Non-medical: Not on file  Tobacco Use  . Smoking status: Never Smoker  . Smokeless tobacco: Never Used  Substance and Sexual Activity  . Alcohol use: Yes    Comment: occasionally  . Drug use: No  . Sexual activity: Yes    Partners: Male    Birth control/protection: Condom    Comment: inconsistent condom use  Lifestyle  . Physical activity:    Days per week: Not on file    Minutes per session: Not on file  . Stress: Not on file  Relationships  . Social connections:    Talks on phone: Not on file    Gets together: Not on file    Attends religious service: Not on file    Active member of club or organization: Not on file    Attends meetings of clubs or organizations: Not on file    Relationship status: Not on file  Other Topics Concern  . Not on file  Social History Narrative   Lives with her parents and younger sister and older brother. Her other sister lives in [place].    Family History  Problem Relation Age of Onset  . Diabetes Mother   . Hypertension Mother   . Diabetes Father   . Hypertension Father   .  Asthma Brother      ROS Review of Systems See HPI Constitution: No fevers or chills No malaise No diaphoresis Skin: No rash or itching Eyes: no blurry vision, no double vision GU: see hpi Neuro: no dizziness or headaches all others reviewed and negative   Objective: Vitals:   08/06/18 1534  BP: 132/88  Pulse: 74  Resp: 16  Temp: 99.1 F (37.3 C)  TempSrc: Oral  SpO2: 100%  Weight: 194 lb 12.8 oz (88.4 kg)  Height: 5\' 3"  (1.6 m)    Physical Exam Physical Exam  Constitutional: She is oriented to person, place, and time. She appears well-developed and well-nourished.  HENT:  Head: Normocephalic and atraumatic.  Eyes: Conjunctivae and EOM are normal.  Cardiovascular: Normal rate, regular rhythm and normal heart sounds.   Pulmonary/Chest:  Effort normal and breath sounds normal. No respiratory distress. She has no wheezes.  Abdominal: Normal appearance and bowel sounds are normal. There is no tenderness. There is no CVA tenderness.  Neurological: She is alert and oriented to person, place, and time.    Wet prep: + clue cells UA neg nit, neg LE   Assessment and Plan Laxmi was seen today for exposure to std and contraception.  Diagnoses and all orders for this visit:  BV (bacterial vaginosis)- will treat with flagyl  Reviewed pregnancy test which was negative  -     Discontinue: metroNIDAZOLE (FLAGYL) 500 MG tablet; Take 1 tablet (500 mg total) by mouth 2 (two) times daily for 7 days. DO NOT CONSUME ALCOHOL WHILE TAKING THIS MEDICATION. -     metroNIDAZOLE (FLAGYL) 500 MG tablet; Take 1 tablet (500 mg total) by mouth 2 (two) times daily for 7 days. DO NOT CONSUME ALCOHOL WHILE TAKING THIS MEDICATION.  Missed menses -     POCT urine pregnancy  Screen for STD (sexually transmitted disease) -     GC/Chlamydia Probe Amp -     Hepatitis B surface antigen -     HIV antibody -     RPR -     POCT Wet + KOH Prep  Urinary symptom or sign -     POCT urinalysis dipstick  Oral contraception initial prescription- Education given regarding options for contraception, including barrier methods, injectable contraception, IUD placement, oral contraceptives.   Other orders -     Norgestimate-Ethinyl Estradiol Triphasic (ORTHO TRI-CYCLEN LO) 0.18/0.215/0.25 MG-25 MCG tab; Take 1 tablet by mouth daily.     [place]

## 2018-08-06 NOTE — Patient Instructions (Addendum)
If you have lab work done today you will be contacted with your lab results within the next 2 weeks.  If you have not heard from [place] then please contact us. The fastest way to get your results is to register for My Chart.   IF you received an x-ray today, you will receive an invoice from [facility]. Please contact [facility]iology at [phone] with questions or concerns regarding your invoice.   IF you received labwork today, you will receive an invoice from [place]. Please contact [facility] at [phone] with questions or concerns regarding your invoice.   Our billing staff will not be able to assist you with questions regarding bills from these companies.  You will be contacted with the lab results as soon as they are available. The fastest way to get your results is to activate your My Chart account. Instructions are located on the last page of this paperwork. If you have not heard from [place] regarding the results in 2 weeks, please contact this office.     Bacterial Vaginosis Bacterial vaginosis is a vaginal infection that occurs when the normal balance of bacteria in the vagina is disrupted. It results from an overgrowth of certain bacteria. This is the most common vaginal infection among women ages 22-44. Because bacterial vaginosis increases your risk for STIs (sexually transmitted infections), getting treated can help reduce your risk for chlamydia, gonorrhea, herpes, and HIV (human immunodeficiency virus). Treatment is also important for preventing complications in pregnant women, because this condition can cause an early (premature) delivery. What are the causes? This condition is caused by an increase in harmful bacteria that are normally present in small amounts in the vagina. However, the reason that the condition develops is not fully understood. What increases the risk? The following factors may make you more likely to develop this condition:  Having a new  sexual partner or multiple sexual partners.  Having unprotected sex.  Douching.  Having an intrauterine device (IUD).  Smoking.  Drug and alcohol abuse.  Taking certain antibiotic medicines.  Being pregnant.  You cannot get bacterial vaginosis from toilet seats, bedding, swimming pools, or contact with objects around you. What are the signs or symptoms? Symptoms of this condition include:  Grey or white vaginal discharge. The discharge can also be watery or foamy.  A fish-like odor with discharge, especially after sexual intercourse or during menstruation.  Itching in and around the vagina.  Burning or pain with urination.  Some women with bacterial vaginosis have no signs or symptoms. How is this diagnosed? This condition is diagnosed based on:  Your medical history.  A physical exam of the vagina.  Testing a sample of vaginal fluid under a microscope to look for a large amount of bad bacteria or abnormal cells. Your health care provider may use a cotton swab or a small wooden spatula to collect the sample.  How is this treated? This condition is treated with antibiotics. These may be given as a pill, a vaginal cream, or a medicine that is put into the vagina (suppository). If the condition comes back after treatment, a second round of antibiotics may be needed. Follow these instructions at home: Medicines  Take over-the-counter and prescription medicines only as told by your health care provider.  Take or use your antibiotic as told by your health care provider. Do not stop taking or using the antibiotic even if you start to feel better. General instructions  If you have a female  sexual partner, tell her that you have a vaginal infection. She should see her health care provider and be treated if she has symptoms. If you have a female sexual partner, he does not need treatment.  During treatment: ? Avoid sexual activity until you finish treatment. ? Do not  douche. ? Avoid alcohol as directed by your health care provider. ? Avoid breastfeeding as directed by your health care provider.  Drink enough water and fluids to keep your urine clear or pale yellow.  Keep the area around your vagina and rectum clean. ? Wash the area daily with warm water. ? Wipe yourself from front to back after using the toilet.  Keep all follow-up visits as told by your health care provider. This is important. How is this prevented?  Do not douche.  Wash the outside of your vagina with warm water only.  Use protection when having sex. This includes latex condoms and dental dams.  Limit how many sexual partners you have. To help prevent bacterial vaginosis, it is best to have sex with just one partner (monogamous).  Make sure you and your sexual partner are tested for STIs.  Wear cotton or cotton-lined underwear.  Avoid wearing tight pants and pantyhose, especially during summer.  Limit the amount of alcohol that you drink.  Do not use any products that contain nicotine or tobacco, such as cigarettes and e-cigarettes. If you need help quitting, ask your health care provider.  Do not use illegal drugs. Where to find more information:  Centers for Disease Control and Prevention: AppraiserFraud.fi  American Sexual Health Association (ASHA): www.ashastd.org  U.S. Department of Health and Financial controller, Office on Women's Health: DustingSprays.pl or SecuritiesCard.it Contact a health care provider if:  Your symptoms do not improve, even after treatment.  You have more discharge or pain when urinating.  You have a fever.  You have pain in your abdomen.  You have pain during sex.  You have vaginal bleeding between periods. Summary  Bacterial vaginosis is a vaginal infection that occurs when the normal balance of bacteria in the vagina is disrupted.  Because bacterial vaginosis increases your risk for  STIs (sexually transmitted infections), getting treated can help reduce your risk for chlamydia, gonorrhea, herpes, and HIV (human immunodeficiency virus). Treatment is also important for preventing complications in pregnant women, because the condition can cause an early (premature) delivery.  This condition is treated with antibiotic medicines. These may be given as a pill, a vaginal cream, or a medicine that is put into the vagina (suppository). This information is not intended to replace advice given to you by your health care provider. Make sure you discuss any questions you have with your health care provider. Document Released: 11/12/2005 Document Revised: 03/18/2017 Document Reviewed: 07/28/2016 Elsevier Interactive Patient Education  [employer].

## 2018-08-06 NOTE — Progress Notes (Deleted)
  No chief complaint on file.   HPI  4 review of systems  Past Medical History:  Diagnosis Date  . Allergy   . Angioedema 05/04/2013  . HSV-2 (herpes simplex virus 2) infection     Current Outpatient Medications  Medication Sig Dispense Refill  . cetirizine (ZYRTEC) 10 MG tablet Take 10 mg by mouth daily.    . metroNIDAZOLE (FLAGYL) 500 MG tablet Take 1 tablet (500 mg total) by mouth 2 (two) times daily with a meal. DO NOT CONSUME ALCOHOL WHILE TAKING THIS MEDICATION. 14 tablet 0  . norethindrone-ethinyl estradiol-iron (MICROGESTIN FE,GILDESS FE,LOESTRIN FE) 1.5-30 MG-MCG tablet Take 1 tablet by mouth daily. 1 Package 12   No current facility-administered medications for this visit.     Allergies: No Known Allergies  Past Surgical History:  Procedure Laterality Date  . TONSILLECTOMY      Social History   Socioeconomic History  . Marital status: Single    Spouse name: n/a  . Number of children: 0  . Years of education: 12+  . Highest education level: Not on file  Occupational History  . Occupation: overnight stocker    Comment: [employer]  Social Needs  . Financial resource strain: Not on file  . Food insecurity:    Worry: Not on file    Inability: Not on file  . Transportation needs:    Medical: Not on file    Non-medical: Not on file  Tobacco Use  . Smoking status: Never Smoker  . Smokeless tobacco: Never Used  Substance and Sexual Activity  . Alcohol use: Yes    Comment: occasionally  . Drug use: No  . Sexual activity: Yes    Partners: Male    Birth control/protection: Condom    Comment: inconsistent condom use  Lifestyle  . Physical activity:    Days per week: Not on file    Minutes per session: Not on file  . Stress: Not on file  Relationships  . Social connections:    Talks on phone: Not on file    Gets together: Not on file    Attends religious service: Not on file    Active member of club or organization: Not on file    Attends meetings of  clubs or organizations: Not on file    Relationship status: Not on file  Other Topics Concern  . Not on file  Social History Narrative   Lives with her parents and younger sister and older brother. Her other sister lives in [place].    Family History  Problem Relation Age of Onset  . Diabetes Mother   . Hypertension Mother   . Diabetes Father   . Hypertension Father   . Asthma Brother      ROS Review of Systems See HPI Constitution: No fevers or chills No malaise No diaphoresis Skin: No rash or itching Eyes: no blurry vision, no double vision GU: no dysuria or hematuria Neuro: no dizziness or headaches ***all others reviewed and negative   Objective: There were no vitals filed for this visit.  Physical Exam  Assessment and Plan There are no diagnoses linked to this encounter.   [place]

## 2018-08-07 LAB — GC/CHLAMYDIA PROBE AMP
Chlamydia trachomatis, NAA: NEGATIVE
Neisseria gonorrhoeae by PCR: NEGATIVE

## 2018-08-07 LAB — HIV ANTIBODY (ROUTINE TESTING W REFLEX): HIV SCREEN 4TH GENERATION: NONREACTIVE

## 2018-08-07 LAB — HEPATITIS B SURFACE ANTIGEN: HEP B S AG: NEGATIVE

## 2018-08-07 LAB — RPR: RPR: NONREACTIVE

## 2018-08-19 ENCOUNTER — Encounter: Payer: Self-pay | Admitting: Family Medicine

## 2018-08-31 ENCOUNTER — Encounter (HOSPITAL_COMMUNITY): Payer: Self-pay

## 2018-08-31 ENCOUNTER — Ambulatory Visit (HOSPITAL_COMMUNITY)
Admission: EM | Admit: 2018-08-31 | Discharge: 2018-08-31 | Disposition: A | Discharge status: Home / Self Care | Payer: BLUE CROSS/BLUE SHIELD | Attending: Family Medicine | Admitting: Family Medicine

## 2018-08-31 DIAGNOSIS — D259 Leiomyoma of uterus, unspecified: Secondary | ICD-10-CM | POA: Insufficient documentation

## 2018-08-31 DIAGNOSIS — Z79899 Other long term (current) drug therapy: Secondary | ICD-10-CM | POA: Diagnosis not present

## 2018-08-31 DIAGNOSIS — R140 Abdominal distension (gaseous): Secondary | ICD-10-CM | POA: Insufficient documentation

## 2018-08-31 DIAGNOSIS — R195 Other fecal abnormalities: Secondary | ICD-10-CM

## 2018-08-31 DIAGNOSIS — R197 Diarrhea, unspecified: Secondary | ICD-10-CM | POA: Diagnosis present

## 2018-08-31 DIAGNOSIS — R194 Change in bowel habit: Secondary | ICD-10-CM | POA: Insufficient documentation

## 2018-08-31 LAB — POCT PREGNANCY, URINE: Preg Test, Ur: NEGATIVE

## 2018-08-31 NOTE — ED Triage Notes (Addendum)
Pt presents with complaints of diarrhea and abdominal bloating x 1 week. Requesting pelvic exam and to be checked for uti.

## 2018-08-31 NOTE — Discharge Instructions (Addendum)
I recommend Miralax as needed to relieve stool burden Follow-up with gynecology regarding your upcoming surgery. Urine results are normal. I have attached information about the appropriate use of antibiotics.

## 2018-08-31 NOTE — ED Provider Notes (Signed)
[facility]    CSN: [id number] Arrival date & time: 08/31/18  1054     History   Chief Complaint Chief Complaint  Patient presents with  . Diarrhea    HPI Ruth Graham is a 27 y.o. female.   HPI  Patient presents with a complaint of diarrhea.  She reports she is having on average 4 stools per day.  Stools are not watery or loose.  She reports her stools are now mushy.  She has not changed her diet although notes has made efforts to "try and clean her system out".  Concern for UTI but reports she had symptoms over a year ago.  She feels she needs an antibiotic today because she does not feel well.  Patient is being followed by gynecology as she has 2 large uterine fibroids present.  She reports that she is waiting to be scheduled for surgery to have fibroids removed. She has undergone a recent complete physical exam at primary care [place] and had negative TB testing, pregnancy testing.   Past Medical History:  Diagnosis Date  . Allergy   . Angioedema 05/04/2013  . HSV-2 (herpes simplex virus 2) infection     Patient Active Problem List   Diagnosis Date Noted  . Uterine fibroid 09/13/2017  . HSV-2 (herpes simplex virus 2) infection     Past Surgical History:  Procedure Laterality Date  . TONSILLECTOMY      OB History   None      Home Medications    Prior to Admission medications   Medication Sig Start Date End Date Taking? Authorizing Provider  cetirizine (ZYRTEC) 10 MG tablet Take 10 mg by mouth daily.    [provider]  Norgestimate-Ethinyl Estradiol Triphasic (ORTHO TRI-CYCLEN LO) 0.18/0.215/0.25 MG-25 MCG tab Take 1 tablet by mouth daily. 08/06/18   Forrest Moron, MD    Family History Family History  Problem Relation Age of Onset  . Diabetes Mother   . Hypertension Mother   . Diabetes Father   . Hypertension Father   . Asthma Brother     Social History Social History   Tobacco Use  . Smoking status: Never Smoker  . Smokeless  tobacco: Never Used  Substance Use Topics  . Alcohol use: Yes    Comment: occasionally  . Drug use: No     Allergies   Patient has no known allergies.   Review of Systems Review of Systems Pertinent negatives listed in HPI Physical Exam Triage Vital Signs ED Triage Vitals  Enc Vitals Group     BP 08/31/18 1140 (!) 142/92     Pulse Rate 08/31/18 1140 (!) 104     Resp 08/31/18 1140 18     Temp 08/31/18 1140 98 F (36.7 C)     Temp src --      SpO2 08/31/18 1140 100 %     Weight --      Height --      Head Circumference --      Peak Flow --      Pain Score 08/31/18 1141 4     Pain Loc --      Pain Edu? --      Excl. in [place]? --    No data found.  Updated Vital Signs BP (!) 142/92   Pulse (!) 104   Temp 98 F (36.7 C)   Resp 18   LMP 08/13/2018   SpO2 100%   Visual Acuity Right Eye Distance:  Left Eye Distance:   Bilateral Distance:    Right Eye Near:   Left Eye Near:    Bilateral Near:     Physical Exam General appearance: alert, well developed, well nourished, cooperative and in no distress Head: Normocephalic, without obvious abnormality, atraumatic Respiratory: Respirations even and unlabored, normal respiratory rate Cardiovascular: Normal rate and rhythm on exam.  No murmurs or gallops rhythms noted. Gastro: Positive for abdominal distention. Negative of abdominal tenderness.  Active bowel sounds x4. Extremities: No gross deformities Skin: Skin color, texture, turgor normal. No rashes seen  Psych: Appropriate mood and affect. Neurologic: Mental status: Alert, oriented to person, place, and time, thought content appropriate.  UC Treatments / Results  Labs (all labs ordered are listed, but only abnormal results are displayed) Labs Reviewed - No data to display  EKG None  Radiology No results found.  Procedures Procedures (including critical care time)  Medications Ordered in UC Medications - No data to display  Initial Impression /  Assessment and Plan / UC Course  I have reviewed the triage vital signs and the nursing notes.  Pertinent labs & imaging results that were available during my care of the patient were reviewed by me and considered in my medical decision making (see chart for details).  Clinical Course as of Sep 03 2251  Sun Aug 31, 2018  1247 POCT Pregnancy, Urine [KH]  1247 POCT Urinalysis, Dipstick [KH]     Final Clinical Impressions(s) / UC Diagnoses   Final diagnoses:  Change in consistency of stool  Abdominal bloating    Patient presents today with a complaint of change in the consistency of her stool and abdominal bloating. She has a known uterine fibroid which will require surgical removal. She is followed by Dr. Florene Glen at [facility] [place] and I advised her to follow-up with gynecology as pressure/bloating is likely related to uterine fibroid. Provided education that she does not warrant an antibiotic as she does not have an infection. All point of care testing negative. Reassurance provided and explained the stools are not diarrhea and not constipation. Stools can change with changes in diet. Miralax is a relatively mild option if she wants to perform a non-GI irritating colon cleanse. Patient verbalized understanding and will follow-up with PCP if symptoms persists.  Discharge Instructions     I recommend Miralax as needed to relieve stool burden Follow-up with gynecology regarding your upcoming surgery. Urine results are normal. I have attached information about the appropriate use of antibiotics.    ED Prescriptions    None     Controlled Substance Prescriptions  Controlled Substance Registry consulted? Not Applicable   Scot Jun, FNP 09/02/18 2307

## 2018-09-01 ENCOUNTER — Ambulatory Visit: Payer: BLUE CROSS/BLUE SHIELD | Admitting: Family Medicine

## 2018-09-01 VITALS — BP 136/89 | HR 97 | Temp 98.8°F | Resp 16 | Ht 63.0 in | Wt 194.0 lb

## 2018-09-01 DIAGNOSIS — D259 Leiomyoma of uterus, unspecified: Secondary | ICD-10-CM

## 2018-09-01 DIAGNOSIS — R197 Diarrhea, unspecified: Secondary | ICD-10-CM

## 2018-09-01 DIAGNOSIS — R109 Unspecified abdominal pain: Secondary | ICD-10-CM | POA: Diagnosis not present

## 2018-09-01 LAB — POCT URINALYSIS DIP (DEVICE)
Bilirubin Urine: NEGATIVE
Glucose, UA: NEGATIVE mg/dL
HGB URINE DIPSTICK: NEGATIVE
KETONES UR: NEGATIVE mg/dL
Leukocytes, UA: NEGATIVE
Nitrite: NEGATIVE
PH: 5.5 (ref 5.0–8.0)
Protein, ur: NEGATIVE mg/dL
Specific Gravity, Urine: <=1.005 (ref 1.005–1.030)
Urobilinogen, UA: 0.2 mg/dL (ref 0.0–1.0)

## 2018-09-01 LAB — URINE CULTURE

## 2018-09-01 MED ORDER — DICYCLOMINE HCL 10 MG PO CAPS: 10.0000 mg | ORAL_CAPSULE | Freq: Three times a day (TID) | ORAL | 0 refills | Status: DC

## 2018-09-01 NOTE — Progress Notes (Signed)
Patient ID: Ruth Graham, female    DOB: 07-11-1991  Age: 27 y.o. MRN: [id number]  Chief Complaint  Patient presents with  . Diarrhea    x 2 wks  . GI Problem    x 2 wks    Subjective:   Patient is here with a 2-week history of diarrhea.  She had a little fever the first day.  She wondered if she could take some antibiotics to get rid of it.  She is just having loose yellow stools half a dozen times a day.  Used to be formed bowel movements before she got sick, now she is not having any formed bowel movements.  She gets some cramping with it.  She has been seeing a gynecologist, and is apparently being scheduled for surgery on her uterine fibroids.  She wondered why they had not done something sooner.  A year ago they knew she had large fibroids.  She is supposed to be hearing back from her gyn, Tod Persia, at the [facility] of [place].  Current allergies, medications, problem list, past/family and social histories reviewed.  Objective:  BP 136/89   Pulse 97   Temp 98.8 F (37.1 C) (Oral)   Resp 16   Ht 5\' 3"  (1.6 m)   Wt 194 lb (88 kg)   LMP 08/13/2018   SpO2 96%   BMI 34.37 kg/m   No major acute distress.  No CVA tenderness.  Abdomen is significantly enlarged, with a 24 to 28 weeks size uterus, not be feeling abdomen.  In reviewing her old records a year ago she apparently had a 20-week uterus.  She has been being followed by a gynecologist.  Really is not that tender.  Assessment & Plan:   Assessment: 1. Diarrhea, unspecified type   2. Uterine leiomyoma, unspecified location   3. Abdominal cramping       Plan: Insisted she get back in touch with her gynecologist to make sure they follow through with things.  Orders Placed This Encounter  Procedures  . Stool culture  . Clostridium Difficile by PCR    Order Specific Question:   Is your patient experiencing loose or watery stools (3 or more in 24 hours)?    Answer:   Yes    Order Specific Question:   Has  the patient received laxatives in the last 24 hours?    Answer:   No    Order Specific Question:   Has a negative Cdiff test resulted in the last 7 days?    Answer:   No  . CBC  . Comprehensive metabolic panel    Meds ordered this encounter  Medications  . dicyclomine (BENTYL) 10 MG capsule    Sig: Take 1 capsule (10 mg total) by mouth 4 (four) times daily -  before meals and at bedtime.    Dispense:  20 capsule    Refill:  0         Patient Instructions   Take dicyclomine 10 mg 1 before meals and at bedtime as needed for the diarrhea and/or cramping.  May give you a little bit of a dry mouth sensation.  It is very important that you continue to follow with your gynecologist to get the uterine fibroids taken care of.  I am doing some basic laboratory testing on you because of the diarrhea, including cultures to decide if other treatment is needed.  However I believe that part of your problem is just the pressure of the  very large uterus and that needs to be taking care of.  Return as necessary      If you have lab work done today you will be contacted with your lab results within the next 2 weeks.  If you have not heard from [place] then please contact us. The fastest way to get your results is to register for My Chart.   IF you received an x-ray today, you will receive an invoice from [facility] Radiology. Please contact [facility] Radiology at [phone] with questions or concerns regarding your invoice.   IF you received labwork today, you will receive an invoice from [place]. Please contact [facility] at [phone] with questions or concerns regarding your invoice.   Our billing staff will not be able to assist you with questions regarding bills from these companies.  You will be contacted with the lab results as soon as they are available. The fastest way to get your results is to activate your My Chart account. Instructions are located on the last page of this  paperwork. If you have not heard from [place] regarding the results in 2 weeks, please contact this office.         Return if symptoms worsen or fail to improve.   Ruben Reason, MD 09/01/2018

## 2018-09-01 NOTE — Patient Instructions (Addendum)
Take dicyclomine 10 mg 1 before meals and at bedtime as needed for the diarrhea and/or cramping.  May give you a little bit of a dry mouth sensation.  It is very important that you continue to follow with your gynecologist to get the uterine fibroids taken care of.  I am doing some basic laboratory testing on you because of the diarrhea, including cultures to decide if other treatment is needed.  However I believe that part of your problem is just the pressure of the very large uterus and that needs to be taking care of.  Return as necessary      If you have lab work done today you will be contacted with your lab results within the next 2 weeks.  If you have not heard from [place] then please contact us. The fastest way to get your results is to register for My Chart.   IF you received an x-ray today, you will receive an invoice from [facility] Radiology. Please contact [facility] Radiology at [phone] with questions or concerns regarding your invoice.   IF you received labwork today, you will receive an invoice from [place]. Please contact [facility] at [phone] with questions or concerns regarding your invoice.   Our billing staff will not be able to assist you with questions regarding bills from these companies.  You will be contacted with the lab results as soon as they are available. The fastest way to get your results is to activate your My Chart account. Instructions are located on the last page of this paperwork. If you have not heard from [place] regarding the results in 2 weeks, please contact this office.

## 2018-09-02 LAB — COMPREHENSIVE METABOLIC PANEL
A/G RATIO: 1.5 (ref 1.2–2.2)
ALK PHOS: 39 IU/L (ref 39–117)
ALT: 9 IU/L (ref 0–32)
AST: 12 IU/L (ref 0–40)
Albumin: 4.4 g/dL (ref 3.5–5.5)
BILIRUBIN TOTAL: 1.2 mg/dL (ref 0.0–1.2)
BUN/Creatinine Ratio: 7 — ABNORMAL LOW (ref 9–23)
BUN: 6 mg/dL (ref 6–20)
CALCIUM: 9.9 mg/dL (ref 8.7–10.2)
CHLORIDE: 102 mmol/L (ref 96–106)
CO2: 20 mmol/L (ref 20–29)
Creatinine, Ser: 0.86 mg/dL (ref 0.57–1.00)
GFR calc Af Amer: 107 mL/min/{1.73_m2} (ref >59)
GFR calc non Af Amer: 93 mL/min/{1.73_m2} (ref >59)
GLOBULIN, TOTAL: 3 g/dL (ref 1.5–4.5)
Glucose: 97 mg/dL (ref 65–99)
POTASSIUM: 4.3 mmol/L (ref 3.5–5.2)
SODIUM: 140 mmol/L (ref 134–144)
Total Protein: 7.4 g/dL (ref 6.0–8.5)

## 2018-09-02 LAB — CBC
HEMATOCRIT: 41.8 % (ref 34.0–46.6)
Hemoglobin: 13.4 g/dL (ref 11.1–15.9)
MCH: 26.6 pg (ref 26.6–33.0)
MCHC: 32.1 g/dL (ref 31.5–35.7)
MCV: 83 fL (ref 79–97)
Platelets: 362 10*3/uL (ref 150–450)
RBC: 5.03 x10E6/uL (ref 3.77–5.28)
RDW: 12.3 % (ref 12.3–15.4)
WBC: 6.4 10*3/uL (ref 3.4–10.8)

## 2018-09-03 ENCOUNTER — Ambulatory Visit: Payer: BLUE CROSS/BLUE SHIELD | Admitting: Family Medicine

## 2018-09-03 ENCOUNTER — Encounter: Payer: Self-pay | Admitting: Radiology

## 2018-09-03 LAB — CLOSTRIDIUM DIFFICILE BY PCR: Toxigenic C. Difficile by PCR: NEGATIVE

## 2018-09-06 LAB — STOOL CULTURE: E COLI SHIGA TOXIN ASSAY: NEGATIVE

## 2018-09-08 ENCOUNTER — Encounter: Payer: Self-pay | Admitting: *Deleted

## 2018-09-08 ENCOUNTER — Other Ambulatory Visit: Payer: Self-pay

## 2018-09-08 ENCOUNTER — Emergency Department (HOSPITAL_COMMUNITY)
Admission: EM | Admit: 2018-09-08 | Discharge: 2018-09-08 | Disposition: A | Discharge status: Home / Self Care | Payer: BLUE CROSS/BLUE SHIELD | Attending: Emergency Medicine | Admitting: Emergency Medicine

## 2018-09-08 ENCOUNTER — Emergency Department (HOSPITAL_COMMUNITY): Payer: BLUE CROSS/BLUE SHIELD

## 2018-09-08 ENCOUNTER — Encounter (HOSPITAL_COMMUNITY): Payer: Self-pay

## 2018-09-08 DIAGNOSIS — Z79899 Other long term (current) drug therapy: Secondary | ICD-10-CM | POA: Insufficient documentation

## 2018-09-08 DIAGNOSIS — J069 Acute upper respiratory infection, unspecified: Secondary | ICD-10-CM | POA: Insufficient documentation

## 2018-09-08 DIAGNOSIS — Z209 Contact with and (suspected) exposure to unspecified communicable disease: Secondary | ICD-10-CM | POA: Insufficient documentation

## 2018-09-08 DIAGNOSIS — K5900 Constipation, unspecified: Secondary | ICD-10-CM | POA: Diagnosis not present

## 2018-09-08 DIAGNOSIS — R0981 Nasal congestion: Secondary | ICD-10-CM | POA: Diagnosis present

## 2018-09-08 LAB — GROUP A STREP BY PCR: Group A Strep by PCR: NOT DETECTED

## 2018-09-08 LAB — POC URINE PREG, ED: Preg Test, Ur: NEGATIVE

## 2018-09-08 NOTE — Discharge Instructions (Addendum)
Please return to the Emergency Department for any new or worsening symptoms or if your symptoms do not improve. Please be sure to follow up with your Primary Care Physician as soon as possible regarding your visit today. If you do not have a Primary Doctor please use the resources below to establish one. Please drink plenty of water and get plenty of rest to help with your cold symptoms.  You may use Tylenol as directed on the packaging for pain relief. Please drink plenty of water and you can use MiraLAX as directed on the packaging to help with your bowel movements.  Contact a health care provider if: You are getting worse rather than better. Your symptoms are not controlled by medicine. You have chills. You have worsening shortness of breath. You have brown or red mucus. You have yellow or brown nasal discharge. You have pain in your face, especially when you bend forward. You have a fever. You have swollen neck glands. You have pain while swallowing. You have white areas in the back of your throat. Get help right away if: You have severe or persistent: Headache. Ear pain. Sinus pain. Chest pain. You have chronic lung disease and any of the following: Wheezing. Prolonged cough. Coughing up blood. A change in your usual mucus. You have a stiff neck. You have changes in your: Vision. Hearing. Thinking. Mood. Contact a health care provider if: You have pain that gets worse. You have a fever. You do not have a bowel movement after 4 days. You vomit. You are not hungry. You lose weight. You are bleeding from the anus. You have thin, pencil-like stools. Get help right away if: You have a fever and your symptoms suddenly get worse. You leak stool or have blood in your stool. Your abdomen is bloated. You have severe pain in your abdomen. You feel dizzy or you faint.  Do not take your medicine if  develop an itchy rash, swelling in your mouth or lips, or difficulty  breathing.   RESOURCE GUIDE  Chronic Pain Problems: Contact [facility]  [phone] Patients need to be referred by their primary care doctor.  Insufficient Money for Medicine: Contact United Way:  call "211" or [place] [phone].  No Primary Care Doctor: Call Health Connect  [phone] - can help you locate a primary care doctor that  accepts your insurance, provides certain services, etc. Physician Referral [phone]  Agencies that provide inexpensive medical care: [facility]  [facility]  [phone] [facility]  [phone] [facility]  [phone] [facility]  [phone] [facility]  [phone]  [place] Providers: [facility]- [address], [address]  [phone], Mon-Fri 9am-7pm, Sat 9am-1pm [place], [address] [place]  [place], [address] [place]  [place]- [address]  [place], [address], [phone]  Only accepts [place] patients after they have their name  applied to their card  Self Pay (no insurance) in [facility]: Sickle Cell Patients: Dr Kevan Ny, [facility]  [place], [facility]- [place]  [place] [place]- [place], [address]       -  [facility]- see information above (Speak to [employer] if you do not have insurance)       -  [facility]- [place], [place],  [place], [place]  Dr Vista Lawman-  [address], [place], [facility]- [address]  [phone]       -  [facility]- [address], [place], also [address] [phone]       -    [facility]- [address], [place], 1st & 3rd Saturday   every month, 10am-1pm  1) Find a Doctor and Pay Out of Pocket Although you won't have to find out who is covered by your insurance plan, it is a good idea to ask around and get recommendations. You will then need to call the office and see if the doctor you have chosen will accept you as a new patient and what types of options they offer for patients who are self-pay. Some doctors offer discounts or will set up payment plans for their patients who do not have insurance, but you will need to ask so you aren't surprised when you get to your appointment.  2) Contact Your Local Health Department Not all health departments have doctors that can see patients for sick visits, but many do, so it is worth a call to see if yours does. If you don't know where your local health department is, you can check in your phone book. The CDC also has a tool to help you locate your state's health department, and many state websites also have listings of all of their local health departments.  3) Find a [place] [facility] If your illness is not likely to be very severe or complicated, you may want to try a walk in clinic. These are popping up all over the country in pharmacies, drugstores, and shopping centers. They're usually staffed by nurse practitioners or physician assistants that have been trained to treat common illnesses and complaints. They're usually fairly quick and inexpensive. However, if you have serious medical issues or chronic medical problems, these are probably not your best option  [facility] [place], [facility], [address], [place], phone [phone] or [phone].  Monday - Friday, call for an appointment. [place], [facility], [address], [place], phone [phone] or [phone].  Monday - Friday, call for an appointment.  Abuse/Neglect: [place] [phone] [place] [phone] (After Hours)  Emergency Shelter:  [facility] [phone]  Maternity Homes: [facility] at the [place] [phone] [place] [phone]  MRSA Hotline #:   315-552-1754  [facility] Dept. [address]         [place]  Sela Hua Phone:  [phone]                                  Phone:  [phone]                   Phone:  [place], [place]- [phone]       -     [facility] in [place], [address],                                  [phone], [place] [phone] or [phone] (After Hours)   [place]  Substance Abuse Resources: Alcohol and Drug Services  ([phone] [place] [phone] [facility] [place] [phone] Residential & Outpatient Substance Abuse Program  4148536109  Psychological Services: [place]  [phone] [place]  [place], [place]. [address] [place], [place]: [phone] or [phone], PicCapture.uy  Dental Assistance  If unable to pay or uninsured, contact:  [facility] or [facility]. to become qualified for the adult dental clinic.  Patients with Medicaid: [facility] [phone] [address], [place] [address] [phone]  If unable to pay, or uninsured, contact [facility] [phone])873-6614) or [place] [phone] in [place], [place] in [facility]) to become qualified for the adult dental clinic   Other [place]- [place], [place], [place], [address], [place], [place], 2nd and 4th Thursday of the month at 6:30am.  10 clients each day by appointment, can sometimes see walk-in patients if someone does not show for an appointment. [facility]- [address] Hillard Danker [place], [place], [address], [place], [place], [place], [address], [place] Department- [phone] [place] [facility] [phone]

## 2018-09-08 NOTE — ED Triage Notes (Signed)
Pt reports URI symptoms (congestion, head pressure). Pt reports constipation. Pt reports BMs daily but states "they aint coming our like they supposed to"

## 2018-09-08 NOTE — ED Provider Notes (Signed)
[facility] DEPT Provider Note   CSN: [id number] Arrival date & time: 09/08/18  6283     History   Chief Complaint Chief Complaint  Patient presents with  . URI  . Constipation    HPI Ruth Graham is a 27 y.o. female presenting for 1 week of nasal congestion and rhinorrhea as well as sore throat.  Patient states that these symptoms began gradually 7 days ago and is mild in severity and constant.  Patient states that she has not used anything for her symptoms.  Patient also states that she has had a mild nonproductive cough for the last 7 days.  Patient states that some of her coworkers have been sick with similar symptoms.  Additionally patient states that she has felt constipated over the last week.  Patient states that she has had 1-2 bowel movements daily however they have been harder and smaller than normal.  Describes her stool as hard balls.  Patient denies blood in the stool or change in stool color.  Patient states that she has not taken anything to help with her symptoms.  Patient denies abdominal pain.  HPI  Past Medical History:  Diagnosis Date  . Allergy   . Angioedema 05/04/2013  . HSV-2 (herpes simplex virus 2) infection     Patient Active Problem List   Diagnosis Date Noted  . Uterine fibroid 09/13/2017  . HSV-2 (herpes simplex virus 2) infection     Past Surgical History:  Procedure Laterality Date  . TONSILLECTOMY       OB History   None      Home Medications    Prior to Admission medications   Medication Sig Start Date End Date Taking? Authorizing Provider  cetirizine (ZYRTEC) 10 MG tablet Take 10 mg by mouth daily as needed for allergies or rhinitis.     [provider]  dicyclomine (BENTYL) 10 MG capsule Take 1 capsule (10 mg total) by mouth 4 (four) times daily -  before meals and at bedtime. 09/01/18   Posey Boyer, MD  Norgestimate-Ethinyl Estradiol Triphasic (ORTHO TRI-CYCLEN LO) 0.18/0.215/0.25  MG-25 MCG tab Take 1 tablet by mouth daily. Patient not taking: Reported on 09/08/2018 08/06/18   Forrest Moron, MD    Family History Family History  Problem Relation Age of Onset  . Diabetes Mother   . Hypertension Mother   . Diabetes Father   . Hypertension Father   . Asthma Brother     Social History Social History   Tobacco Use  . Smoking status: Never Smoker  . Smokeless tobacco: Never Used  Substance Use Topics  . Alcohol use: Yes    Comment: occasionally  . Drug use: No     Allergies   Patient has no known allergies.   Review of Systems Review of Systems  Constitutional: Negative.  Negative for chills and fever.  HENT: Positive for congestion, rhinorrhea and sore throat. Negative for drooling, facial swelling and trouble swallowing.   Respiratory: Positive for cough. Negative for shortness of breath.   Cardiovascular: Negative.  Negative for chest pain and leg swelling.  Gastrointestinal: Positive for constipation. Negative for abdominal pain, diarrhea, nausea and vomiting.  Genitourinary: Negative.  Negative for dysuria and hematuria.  Musculoskeletal: Negative.  Negative for arthralgias and myalgias.  Skin: Negative.  Negative for rash.  Neurological: Negative.  Negative for weakness and headaches.    Physical Exam Updated Vital Signs BP 115/72   Pulse 87   Temp 98.4 F (  36.9 C) (Oral)   Resp 18   LMP 08/13/2018   SpO2 98%   Physical Exam  Constitutional: She appears well-developed and well-nourished. No distress.  HENT:  Head: Normocephalic and atraumatic.  Right Ear: Hearing, tympanic membrane, external ear and ear canal normal.  Left Ear: Hearing, tympanic membrane, external ear and ear canal normal.  Nose: Rhinorrhea present.  Mouth/Throat: Uvula is midline, oropharynx is clear and moist and mucous membranes are normal. No trismus in the jaw. No uvula swelling. No oropharyngeal exudate, posterior oropharyngeal edema, posterior oropharyngeal  erythema or tonsillar abscesses.  Eyes: Pupils are equal, round, and reactive to light. Conjunctivae and EOM are normal.  Neck: Trachea normal, normal range of motion, full passive range of motion without pain and phonation normal. Neck supple. No tracheal tenderness present. No neck rigidity. No tracheal deviation, no edema and no erythema present.  Cardiovascular: Normal rate, regular rhythm, normal heart sounds and intact distal pulses.  Pulses:      Dorsalis pedis pulses are 2+ on the right side, and 2+ on the left side.       Posterior tibial pulses are 2+ on the right side, and 2+ on the left side.  Pulmonary/Chest: Effort normal and breath sounds normal. No respiratory distress. She has no decreased breath sounds. She has no wheezes. She has no rhonchi.  Abdominal: Soft. Bowel sounds are normal. There is no tenderness. There is no rigidity, no rebound, no guarding and no CVA tenderness.  Patient with mild lower abdominal distention, patient denies change in abdomen appearance, known uterine fibroids scheduled for myomectomy.  Musculoskeletal: Normal range of motion.  Neurological: She is alert. No sensory deficit. GCS eye subscore is 4. GCS verbal subscore is 5. GCS motor subscore is 6.  Speech is clear and goal oriented, follows commands Major Cranial nerves without deficit, no facial droop Normal strength in upper and lower extremities bilaterally including dorsiflexion and plantar flexion, strong and equal grip strength Sensation normal to light touch Moves extremities without ataxia, coordination intact Normal gait  Skin: Skin is warm and dry.  Psychiatric: She has a normal mood and affect. Her behavior is normal.   ED Treatments / Results  Labs (all labs ordered are listed, but only abnormal results are displayed) Labs Reviewed  GROUP A STREP BY PCR  POC URINE PREG, ED    EKG None  Radiology Dg Chest 2 View  Result Date: 09/08/2018 CLINICAL DATA:  Upper respiratory  infection EXAM: CHEST - 2 VIEW COMPARISON:  None. FINDINGS: Heart and mediastinal contours are within normal limits. No focal opacities or effusions. No acute bony abnormality. IMPRESSION: No active cardiopulmonary disease. Electronically Signed   By: Rolm Baptise M.D.   On: 09/08/2018 09:56    Procedures Procedures (including critical care time)  Medications Ordered in ED Medications - No data to display   Initial Impression / Assessment and Plan / ED Course  I have reviewed the triage vital signs and the nursing notes.  Pertinent labs & imaging results that were available during my care of the patient were reviewed by me and considered in my medical decision making (see chart for details).    Patient presented with complaint of 1 week of URI symptoms and constipation with regular however smaller/hard bowel movements.  Patient with known uterine fibroid disease, no new distention, no abdominal pain and daily bowel movements.  Chest x-ray negative Strep test negative Pregnancy negative  Patient well-appearing, afebrile, not tachycardic, not hypotensive and in no  acute distress.  Patient resting comfortably, no abdominal tenderness on examination.  Patient's airway intact, no signs of Ludwig's angina, peritonsillar abscess or retropharyngeal abscess.  Patient encouraged to drink plenty of water and use over-the-counter MiraLAX to help with her constipation.  Patients symptoms are consistent with URI, likely viral etiology. Discussed that antibiotics are not indicated for viral infections. Pt will be discharged with symptomatic treatment.  Encouraged fluid intake and rest.  Verbalizes understanding and is agreeable with plan. Pt is hemodynamically stable & in NAD prior to dc.  At this time there does not appear to be any evidence of an acute emergency medical condition and the patient appears stable for discharge with appropriate outpatient follow up. Diagnosis was discussed with patient  who verbalizes understanding of care plan and is agreeable to discharge. I have discussed return precautions with patient who verbalizes understanding of return precautions. Patient strongly encouraged to follow-up with their PCP. All questions answered.   Note: Portions of this report may have been transcribed using voice recognition software. Every effort was made to ensure accuracy; however, inadvertent computerized transcription errors may still be present.  Final Clinical Impressions(s) / ED Diagnoses   Final diagnoses:  Viral upper respiratory tract infection  Constipation, unspecified constipation type    ED Discharge Orders    None       Gari Crown 09/08/18 1209    Daleen Bo, MD 09/08/18 2019

## 2018-09-10 ENCOUNTER — Other Ambulatory Visit: Payer: Self-pay | Admitting: Obstetrics and Gynecology

## 2018-09-13 ENCOUNTER — Encounter (HOSPITAL_COMMUNITY): Payer: Self-pay | Admitting: *Deleted

## 2018-09-13 ENCOUNTER — Inpatient Hospital Stay (HOSPITAL_COMMUNITY)
Admission: AD | Admit: 2018-09-13 | Discharge: 2018-09-13 | Disposition: A | Discharge status: Home / Self Care | Payer: BLUE CROSS/BLUE SHIELD | Source: Ambulatory Visit | Attending: Obstetrics and Gynecology | Admitting: Obstetrics and Gynecology

## 2018-09-13 DIAGNOSIS — Z113 Encounter for screening for infections with a predominantly sexual mode of transmission: Secondary | ICD-10-CM | POA: Diagnosis not present

## 2018-09-13 DIAGNOSIS — D259 Leiomyoma of uterus, unspecified: Secondary | ICD-10-CM | POA: Diagnosis not present

## 2018-09-13 DIAGNOSIS — R102 Pelvic and perineal pain: Secondary | ICD-10-CM | POA: Diagnosis present

## 2018-09-13 HISTORY — DX: Benign neoplasm of connective and other soft tissue, unspecified: D21.9

## 2018-09-13 LAB — URINALYSIS, ROUTINE W REFLEX MICROSCOPIC
BACTERIA UA: NONE SEEN
Bilirubin Urine: NEGATIVE
Glucose, UA: NEGATIVE mg/dL
Ketones, ur: NEGATIVE mg/dL
Leukocytes, UA: NEGATIVE
Nitrite: NEGATIVE
Protein, ur: NEGATIVE mg/dL
Specific Gravity, Urine: 1.019 (ref 1.005–1.030)
pH: 5 (ref 5.0–8.0)

## 2018-09-13 LAB — WET PREP, GENITAL
CLUE CELLS WET PREP: NONE SEEN
Sperm: NONE SEEN
TRICH WET PREP: NONE SEEN
YEAST WET PREP: NONE SEEN

## 2018-09-13 LAB — POCT PREGNANCY, URINE: PREG TEST UR: NEGATIVE

## 2018-09-13 NOTE — MAU Provider Note (Signed)
Chief Complaint: STD testing and Pelvic Pain   First Provider Initiated Contact with Patient 09/13/18 [phone]     SUBJECTIVE HPI: Ruth Graham is a 27 y.o. a non pregnant patient who presents to Maternity Admissions reporting pelvic pain & requesting STD testing. Had encounter with new partner last month. States the condom broke. Went to an urgent care shortly after & had negative STD testing. States since then she's had some pelvic discomfort & wants to make sure that she doesn't have an STD. Does have a large uterine fibroid that is scheduled to be surgically removed next month.  Denies n/v/d, constipation, dysuria, fever/chills. LMP 10/13, ended yesterday. No discharge or vaginal irritation.   Location: lower abdomen/pelvis Quality: cramping Severity: 3/10 on pain scale Duration: 1 month Timing: intermittent Modifying factors: nothing makes better or worse Associated signs and symptoms: none  Past Medical History:  Diagnosis Date  . Allergy   . Angioedema 05/04/2013  . Fibroid   . HSV-2 (herpes simplex virus 2) infection    OB History  No data available   Past Surgical History:  Procedure Laterality Date  . TONSILLECTOMY     Social History   Socioeconomic History  . Marital status: Single    Spouse name: n/a  . Number of children: 0  . Years of education: 12+  . Highest education level: Not on file  Occupational History  . Occupation: overnight stocker    Comment: [employer]  Social Needs  . Financial resource strain: Not on file  . Food insecurity:    Worry: Not on file    Inability: Not on file  . Transportation needs:    Medical: Not on file    Non-medical: Not on file  Tobacco Use  . Smoking status: Never Smoker  . Smokeless tobacco: Never Used  Substance and Sexual Activity  . Alcohol use: Yes    Comment: occasionally  . Drug use: No  . Sexual activity: Yes    Partners: Male    Birth control/protection: Condom    Comment: inconsistent condom use  Lifestyle   . Physical activity:    Days per week: Not on file    Minutes per session: Not on file  . Stress: Not on file  Relationships  . Social connections:    Talks on phone: Not on file    Gets together: Not on file    Attends religious service: Not on file    Active member of club or organization: Not on file    Attends meetings of clubs or organizations: Not on file    Relationship status: Not on file  . Intimate partner violence:    Fear of current or ex partner: Not on file    Emotionally abused: Not on file    Physically abused: Not on file    Forced sexual activity: Not on file  Other Topics Concern  . Not on file  Social History Narrative   Lives with her parents and younger sister and older brother. Her other sister lives in [place].   Family History  Problem Relation Age of Onset  . Diabetes Mother   . Hypertension Mother   . Diabetes Father   . Hypertension Father   . Asthma Brother    No current facility-administered medications on file prior to encounter.    Current Outpatient Medications on File Prior to Encounter  Medication Sig Dispense Refill  . cetirizine (ZYRTEC) 10 MG tablet Take 10 mg by mouth daily as needed for allergies  or rhinitis.     Marland Kitchen dicyclomine (BENTYL) 10 MG capsule Take 1 capsule (10 mg total) by mouth 4 (four) times daily -  before meals and at bedtime. 20 capsule 0   No Known Allergies  I have reviewed patient's Past Medical Hx, Surgical Hx, Family Hx, Social Hx, medications and allergies.   Review of Systems  Constitutional: Negative.   Gastrointestinal: Positive for abdominal pain. Negative for constipation, diarrhea, nausea and vomiting.  Genitourinary: Negative for dyspareunia, dysuria, vaginal bleeding and vaginal discharge.    OBJECTIVE Patient Vitals for the past 24 hrs:  BP Temp Temp src Pulse Resp SpO2 Weight  09/13/18 1041 125/77 - - 70 17 98 % -  09/13/18 0807 130/81 98.6 F (37 C) Oral 88 17 98 % 85.8 kg    Constitutional: Well-developed, well-nourished female in no acute distress.  Cardiovascular: normal rate & rhythm, no murmur Respiratory: normal rate and effort. Lung sounds clear throughout GI: Abd distended above umbilicus. Abd soft, non-tender, Pos BS x 4. No guarding or rebound tenderness MS: Extremities nontender, no edema, normal ROM Neurologic: Alert and oriented x 4.  GU:     SPECULUM EXAM: NEFG, physiologic discharge, no blood noted, cervix clean  BIMANUAL: No CMT, enlarged fibroid uterus, no adnexal tenderness or masses.    LAB RESULTS Results for orders placed or performed during the hospital encounter of 09/13/18 (from the past 24 hour(s))  Urinalysis, Routine w reflex microscopic     Status: Abnormal   Collection Time: 09/13/18  8:09 AM  Result Value Ref Range   Color, Urine YELLOW YELLOW   APPearance HAZY (A) CLEAR   Specific Gravity, Urine 1.019 1.005 - 1.030   pH 5.0 5.0 - 8.0   Glucose, UA NEGATIVE NEGATIVE mg/dL   Hgb urine dipstick MODERATE (A) NEGATIVE   Bilirubin Urine NEGATIVE NEGATIVE   Ketones, ur NEGATIVE NEGATIVE mg/dL   Protein, ur NEGATIVE NEGATIVE mg/dL   Nitrite NEGATIVE NEGATIVE   Leukocytes, UA NEGATIVE NEGATIVE   WBC, UA 0-5 0 - 5 WBC/hpf   Bacteria, UA NONE SEEN NONE SEEN   Squamous Epithelial / LPF 0-5 0 - 5   Mucus PRESENT   Pregnancy, urine POC     Status: None   Collection Time: 09/13/18  8:34 AM  Result Value Ref Range   Preg Test, Ur NEGATIVE NEGATIVE  Wet prep, genital     Status: Abnormal   Collection Time: 09/13/18  8:44 AM  Result Value Ref Range   Yeast Wet Prep HPF POC NONE SEEN NONE SEEN   Trich, Wet Prep NONE SEEN NONE SEEN   Clue Cells Wet Prep HPF POC NONE SEEN NONE SEEN   WBC, Wet Prep HPF POC FEW (A) NONE SEEN   Sperm NONE SEEN     IMAGING No results found.  MAU COURSE Orders Placed This Encounter  Procedures  . Wet prep, genital  . Urinalysis, Routine w reflex microscopic  . Pregnancy, urine POC  . Discharge  patient   No orders of the defined types were placed in this encounter.   MDM UPT negative GC/CT, wet prep, HIV, RPR collected VSS, NAD No acute processes evident at this time, Pain likely d/t large fibroid. Pt has f/u appt with ob/gyn on Thursday.   ASSESSMENT 1. Screen for STD (sexually transmitted disease)     PLAN Discharge home in stable condition. Discussed safe sex practices GC/CT, HIV, RPR pending F/u with [facility] as scheduled  Follow-up Information    [facility] Follow up.   Specialty:  Obstetrics and Gynecology Contact information: [address] [place] [address] [phone]         Allergies as of 09/13/2018   No Known Allergies     Medication List    STOP taking these medications   Norgestimate-Ethinyl Estradiol Triphasic 0.18/0.215/0.25 MG-25 MCG tab     TAKE these medications   cetirizine 10 MG tablet Commonly known as:  ZYRTEC Take 10 mg by mouth daily as needed for allergies or rhinitis.   dicyclomine 10 MG capsule Commonly known as:  BENTYL Take 1 capsule (10 mg total) by mouth 4 (four) times daily -  before meals and at bedtime.        Jorje Guild, NP 09/13/2018  5:24 PM

## 2018-09-13 NOTE — MAU Note (Signed)
Ruth Graham is a 27 y.o.  here in MAU reporting: +pelvic pain. Desires STD testing and pelvic exam. Endorses having fibroids.  LMP: 10/12--last saturday Onset of complaint: ongoing for the past month. She questioned whether the condom broke. States went to the urgent care at that time to be seen. Reports she self swabbed  and had blood work then. But is seeking a pelvic exam to "make sure everything is ok" due to still having problems. Pain score: 3/10 Denies vaginal bleeding or discharge at this time. Vitals:   09/13/18 0807  BP: 130/81  Pulse: 88  Resp: 17  Temp: 98.6 F (37 C)  SpO2: 98%   Lab orders placed from triage: ua

## 2018-09-13 NOTE — Discharge Instructions (Signed)

## 2018-09-14 LAB — RPR: RPR Ser Ql: NONREACTIVE

## 2018-09-14 LAB — HIV ANTIBODY (ROUTINE TESTING W REFLEX): HIV Screen 4th Generation wRfx: NONREACTIVE

## 2018-09-15 LAB — GC/CHLAMYDIA PROBE AMP (~~LOC~~) NOT AT ARMC
Chlamydia: NEGATIVE
Neisseria Gonorrhea: NEGATIVE

## 2018-09-18 ENCOUNTER — Ambulatory Visit: Payer: BLUE CROSS/BLUE SHIELD | Admitting: Physician Assistant

## 2018-09-19 ENCOUNTER — Ambulatory Visit (HOSPITAL_COMMUNITY)
Admission: EM | Admit: 2018-09-19 | Discharge: 2018-09-19 | Disposition: A | Discharge status: Home / Self Care | Payer: BLUE CROSS/BLUE SHIELD | Attending: Family Medicine | Admitting: Family Medicine

## 2018-09-19 ENCOUNTER — Encounter (HOSPITAL_COMMUNITY): Payer: Self-pay | Admitting: Emergency Medicine

## 2018-09-19 DIAGNOSIS — H6982 Other specified disorders of Eustachian tube, left ear: Secondary | ICD-10-CM

## 2018-09-19 DIAGNOSIS — H9202 Otalgia, left ear: Secondary | ICD-10-CM | POA: Diagnosis not present

## 2018-09-19 MED ORDER — FLUTICASONE PROPIONATE 50 MCG/ACT NA SUSP: 2.0000 | Freq: Every day | NASAL | 0 refills | Status: AC

## 2018-09-19 MED ORDER — PREDNISONE 50 MG PO TABS: 50.0000 mg | ORAL_TABLET | Freq: Every day | ORAL | 0 refills | Status: DC

## 2018-09-19 MED ORDER — CETIRIZINE HCL 10 MG PO TABS: 10.0000 mg | ORAL_TABLET | Freq: Every day | ORAL | 0 refills | Status: AC

## 2018-09-19 MED ORDER — AMOXICILLIN-POT CLAVULANATE 875-125 MG PO TABS: 1.0000 | ORAL_TABLET | Freq: Two times a day (BID) | ORAL | 0 refills | Status: DC

## 2018-09-19 NOTE — ED Triage Notes (Signed)
Pt here for left ear pain onset 1 week associated w/headache, prod cough  Also reports allergic reaction to unknown sub.... Had rash all over body but that has resolved.   A&O x4... NAD.Marland Kitchen. ambulatory

## 2018-09-19 NOTE — Discharge Instructions (Signed)
Start prednisone and flonase for eustachian tube dysfunction. Start allergy medicine such as zyrtec. If symptoms still not improving in 1 week, can fill augmentin to cover for sinus/ear infection. Keep hydrated, your urine should be clear to pale yellow in color. Follow up with PCP/ENT if symptoms still not improving after antibiotics.

## 2018-09-19 NOTE — ED Provider Notes (Signed)
[facility]    CSN: [id number] Arrival date & time: 09/19/18  1035     History   Chief Complaint Chief Complaint  Patient presents with  . Otalgia    HPI Ruth Graham is a 27 y.o. female.   27 year old female comes in for 1 week history of left ear pain.  States prior to this starting, first started with URI symptoms such as cough, congestion, rhinorrhea, headache.  Had subjective fever without chills.  Now with left ear pain, fullness.  Denies drainage.  States she only makes symptoms worse.  Has been drinking fluids, and taking Benadryl without relief.     Past Medical History:  Diagnosis Date  . Allergy   . Angioedema 05/04/2013  . Fibroid   . HSV-2 (herpes simplex virus 2) infection     Patient Active Problem List   Diagnosis Date Noted  . Uterine fibroid 09/13/2017  . HSV-2 (herpes simplex virus 2) infection     Past Surgical History:  Procedure Laterality Date  . TONSILLECTOMY      OB History   None      Home Medications    Prior to Admission medications   Medication Sig Start Date End Date Taking? Authorizing Provider  amoxicillin-clavulanate (AUGMENTIN) 875-125 MG tablet Take 1 tablet by mouth every 12 (twelve) hours. 09/19/18   Tasia Catchings, Amy V, PA-C  cetirizine (ZYRTEC) 10 MG tablet Take 1 tablet (10 mg total) by mouth daily. 09/19/18   Tasia Catchings, Amy V, PA-C  dicyclomine (BENTYL) 10 MG capsule Take 1 capsule (10 mg total) by mouth 4 (four) times daily -  before meals and at bedtime. 09/01/18   Posey Boyer, MD  fluticasone (FLONASE) 50 MCG/ACT nasal spray Place 2 sprays into both nostrils daily. 09/19/18   Tasia Catchings, Amy V, PA-C  predniSONE (DELTASONE) 50 MG tablet Take 1 tablet (50 mg total) by mouth daily. 09/19/18   Ok Edwards, PA-C    Family History Family History  Problem Relation Age of Onset  . Diabetes Mother   . Hypertension Mother   . Diabetes Father   . Hypertension Father   . Asthma Brother     Social History Social History    Tobacco Use  . Smoking status: Never Smoker  . Smokeless tobacco: Never Used  Substance Use Topics  . Alcohol use: Yes    Comment: occasionally  . Drug use: No     Allergies   Patient has no known allergies.   Review of Systems Review of Systems  Reason unable to perform ROS: See HPI as above.     Physical Exam Triage Vital Signs ED Triage Vitals  Enc Vitals Group     BP 09/19/18 1117 132/87     Pulse Rate 09/19/18 1117 87     Resp 09/19/18 1117 20     Temp 09/19/18 1117 98.4 F (36.9 C)     Temp Source 09/19/18 1117 Oral     SpO2 09/19/18 1117 97 %     Weight --      Height --      Head Circumference --      Peak Flow --      Pain Score 09/19/18 1118 3     Pain Loc --      Pain Edu? --      Excl. in [place]? --    No data found.  Updated Vital Signs BP 132/87 (BP Location: Left Arm)   Pulse 87   Temp 98.4  F (36.9 C) (Oral)   Resp 20   LMP 09/06/2018   SpO2 97%   Physical Exam  Constitutional: She is oriented to person, place, and time. She appears well-developed and well-nourished. No distress.  HENT:  Head: Normocephalic and atraumatic.  Right Ear: Tympanic membrane, external ear and ear canal normal. Tympanic membrane is not erythematous and not bulging.  Left Ear: External ear and ear canal normal. Tympanic membrane is erythematous. Tympanic membrane is not bulging.  Nose: Nose normal. Right sinus exhibits no maxillary sinus tenderness and no frontal sinus tenderness. Left sinus exhibits no maxillary sinus tenderness and no frontal sinus tenderness.  Mouth/Throat: Uvula is midline, oropharynx is clear and moist and mucous membranes are normal.  Eyes: Pupils are equal, round, and reactive to light. Conjunctivae are normal.  Neck: Normal range of motion. Neck supple.  Cardiovascular: Normal rate, regular rhythm and normal heart sounds. Exam reveals no gallop and no friction rub.  No murmur heard. Pulmonary/Chest: Effort normal and breath sounds normal.  She has no decreased breath sounds. She has no wheezes. She has no rhonchi. She has no rales.  Lymphadenopathy:    She has no cervical adenopathy.  Neurological: She is alert and oriented to person, place, and time.  Skin: Skin is warm and dry.  Psychiatric: She has a normal mood and affect. Her behavior is normal. Judgment normal.     UC Treatments / Results  Labs (all labs ordered are listed, but only abnormal results are displayed) Labs Reviewed - No data to display  EKG None  Radiology No results found.  Procedures Procedures (including critical care time)  Medications Ordered in UC Medications - No data to display  Initial Impression / Assessment and Plan / UC Course  I have reviewed the triage vital signs and the nursing notes.  Pertinent labs & imaging results that were available during my care of the patient were reviewed by me and considered in my medical decision making (see chart for details).    Prednisone and flonase as directed. Continue antihistamine. Push fluids. Rx of augmentin provided, patient can fill if symptoms still not improving to cover for otitis media/sinusitis. Return precautions given.  Final Clinical Impressions(s) / UC Diagnoses   Final diagnoses:  Left ear pain  Dysfunction of left eustachian tube    ED Prescriptions    Medication Sig Dispense Auth. Provider   fluticasone (FLONASE) 50 MCG/ACT nasal spray Place 2 sprays into both nostrils daily. 1 g Yu, Amy V, PA-C   predniSONE (DELTASONE) 50 MG tablet Take 1 tablet (50 mg total) by mouth daily. 5 tablet Yu, Amy V, PA-C   cetirizine (ZYRTEC) 10 MG tablet Take 1 tablet (10 mg total) by mouth daily. 15 tablet Yu, Amy V, PA-C   amoxicillin-clavulanate (AUGMENTIN) 875-125 MG tablet Take 1 tablet by mouth every 12 (twelve) hours. 14 tablet Tobin Chad, [place] 09/19/18 1148

## 2018-09-24 NOTE — H&P (Signed)
Ruth Graham is a 27 y.o. female, G:0 , who presents for myomectomy because of rapidly enlarging uterine fibroids. Over the past year the patient's uterus has increased on pelvic exam from 20 weeks size to 30 weeks.  Her monthly menstrual flow has been reasonable, lasting 5 days with the need to change her protection 4 times a day.  She admits to some cramping but finds relief with over the counter Ibuprofen.  She has not had any intermenstrual bleeding, changes in bowel function or  dyspareunia.  She admits to  urinary frequency and a decrease in appetite due to a feeling of fullness for the past month.  A pelvic ultrasound on 09/18/2018 showed:  an  anteverted uterus: [9.4 cm from fundus to external os]; 6.90 x 4.85 x 7.19 cm, endometrium: 6.71 mm; #3 sub-serosal fibroids: posterior right 16.20 cm, left posterior lower uterine segment-4.12 cm and left anterior mid-3.45 cm; right ovary-5.08 cm and left ovary-3.82 cm.  In 2017 the measurement from the fundus to external os was 8.2 cm.  Given the short interval  and significant increase in fibroid size  the patient has decided to proceed with an abdominal myomectomy for further evaluation and management.   Past Medical History  OB History: G:0  GYN History: menarche: 27  YO    LMP :09/05/2018 Contraception: Condoms    Denies history of abnormal PAP smear but has a history of chlamydia. Last PAP smear: 2019-normal  Medical History: Seasonal Allergies  Surgical History:  2003  Tonsillectomy/Adenoidectomy Denies problems with anesthesia or history of blood transfusions  Family History: Hypertension, Asthma and Diabetes Mellitus  Social History: Single and employed by [employer] in [employer]; Denies tobacco use and occasionally uses alcohol   Medications: None   Denies sensitivity to soy, latex or adhesives. 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   No Known Allergies  ROS: Admits to  glasses/contact lenses;  but denies headache, vision changes, nasal congestion, dysphagia, tinnitus, dizziness, hoarseness, cough,  chest pain, shortness of breath, nausea, vomiting, diarrhea,constipation,  urinary frequency, urgency  dysuria, hematuria, vaginitis symptoms, pelvic pain, swelling of joints,easy bruising,  myalgias, arthralgias, skin rashes, unexplained weight loss and except as is mentioned in the history of present illness, patient's review of systems is otherwise negative.     Physical Exam  Bp: 122/70   P: 82 bpm   R: 16  Temperature: 98.9 degrees F orally    Weight:  194 lbs.  Height: 5' 3.5 "    BMI: 33.8    O2Sat: 99% (room air)  Neck: supple without masses or thyromegaly Lungs: clear to auscultation Heart: regular rate and rhythm Abdomen: firm mass from pelvis to 3 fingers above umbilicus at left border;  no  tenderness  and no organomegaly Pelvic:EGBUS- wnl; vagina-normal rugae; uterus-enlarged to 30 weeks size, firm and non-tender, cervix without lesions or motion tenderness; adnexae-no tenderness or masses Extremities:  no clubbing, cyanosis or edema   Assesment: Rapidly Enlarging Uterine Fibroid                          Disposition:  A discussion was held with patient regarding the indication for her procedure(s) along with the risks, which include but are not limited to:  reaction to anesthesia, damage to adjacent organs, infection and excessive bleeding. The patient verbalized understanding of these risks and has consented to proceed with an Abdominal Myomectomy at [facility]  on 10/14/18 at 1 p.m.    CSN# [id number]   Ruth Braddy J. Florene Glen, PA-C  for Dr. Franklyn Lor. Dillard

## 2018-09-24 NOTE — Patient Instructions (Addendum)
Your procedure is scheduled on: Tuesday, Nov 19  Enter through the Main Entrance of [facility] at: 11:30 am  Pick up the phone at the desk and dial [phone].  Call this number if you have problems the morning of surgery: [phone].  Remember: Do NOT eat food after midnight Monday.   Do NOT drink clear liquids (including water) after 7 am Tuesday, day of surgery.  Take these medicines the morning of surgery with a SIP OF WATER: None.  Ok to take Zyrtec and flonase nasal spray if needed.  Brush your teeth on the day of surgery.  Stop herbal medications, vitamin supplements, Ibuprofen/NSAIDS 1 week prior to surgery - 10/07/18.  Do NOT wear jewelry (body piercing), metal hair clips/bobby pins, make-up, or nail polish. Do NOT wear lotions, powders, or perfumes.  You may wear deoderant. Do NOT shave for 48 hours prior to surgery. Do NOT bring valuables to the hospital.  Leave suitcase in car.  After surgery it may be brought to your room.  For patients admitted to the hospital, checkout time is 11:00 AM the day of discharge. Have a responsible adult drive you home and stay with you for 24 hours after your procedure.  Home with Sister Richardson [place]iqu[place] cell [phone] or Sister Estill Bamberg cell [phone].

## 2018-09-27 ENCOUNTER — Encounter (HOSPITAL_COMMUNITY): Payer: Self-pay | Admitting: Emergency Medicine

## 2018-09-27 ENCOUNTER — Other Ambulatory Visit: Payer: Self-pay

## 2018-09-27 ENCOUNTER — Emergency Department (HOSPITAL_COMMUNITY)
Admission: EM | Admit: 2018-09-27 | Discharge: 2018-09-27 | Disposition: A | Discharge status: Home / Self Care | Payer: BLUE CROSS/BLUE SHIELD | Attending: Emergency Medicine | Admitting: Emergency Medicine

## 2018-09-27 DIAGNOSIS — H9202 Otalgia, left ear: Secondary | ICD-10-CM | POA: Diagnosis not present

## 2018-09-27 DIAGNOSIS — N898 Other specified noninflammatory disorders of vagina: Secondary | ICD-10-CM | POA: Insufficient documentation

## 2018-09-27 LAB — POC URINE PREG, ED: Preg Test, Ur: NEGATIVE

## 2018-09-27 LAB — WET PREP, GENITAL
Clue Cells Wet Prep HPF POC: NONE SEEN
Sperm: NONE SEEN
Trich, Wet Prep: NONE SEEN
Yeast Wet Prep HPF POC: NONE SEEN

## 2018-09-27 NOTE — ED Triage Notes (Signed)
Pt continues with l ear pain and L head "feels swollen". Pt states she also would like to be evaluated for toxic shock syndrome because she left "detox pearls" in her vagina for 72 hours. Pt states they are "herbal pearls used for detoxing the body". She was given amox 1 week ago and states she is feeling improvement. Denies vaginal pain, denies vaginal discharge.

## 2018-09-27 NOTE — Discharge Instructions (Signed)
Continue taking your home medicines.  Drink plenty of fluids and get plenty of rest.  Use Flonase nasal spray, allergy medicines, and over-the-counter cold medicines for your symptoms.  Follow-up with your OB/GYN for reevaluation. Do not insert anything into the vagina.   Return to the emergency department if any concerning signs or symptoms develop such as fevers, persistent vomiting, or worsening pain.

## 2018-09-27 NOTE — ED Provider Notes (Signed)
[facility] DEPT Provider Note   CSN: [id number] Arrival date & time: 09/27/18  1151     History   Chief Complaint Chief Complaint  Patient presents with  . Otalgia    L  . Headache  . Flank Pain    HPI Ruth Graham is a 27 y.o. female with history of uterine fibroids, HSV-2, seasonal allergies presents for evaluation of ongoing and improving left ear pain for 2 weeks as well as concern for toxic shock syndrome.  She was seen at urgent care for her left ear pain on 09/19/2018 and again in the ED on 09/08/2018 for similar symptoms.  She was given Augmentin, prednisone, Flonase, and Zyrtec which she has been taking with improvement in her symptoms.  She does note ongoing soreness to the left ear, some nasal congestion, and left-sided headaches.  Denies fevers.  She states that 2 months ago in early September she placed "vaginal detox pearls" which she states are "herbal pearls used for detoxing the body "for 72 hours.  She states that she successfully removed the 3 pearls.  She states that she is concerned she may have toxic shock syndrome because she has had a lack of her normal vaginal discharge.  She denies vaginal pain, itching, or abnormal bleeding.  She was recently tested for STDs on 09/13/2018 and has not had any new partners since then.  She declines any additional STD testing today.  She does note some intermittent diarrhea and constipation due to a known large uterine fibroid and has surgery scheduled for later this month for a myomectomy.  She denies nausea, vomiting, urinary symptoms.  She denies any recent rashes.  She was seen and evaluated for her constipation earlier this month and was given MiraLAX with improvement.  A bowel movement was yesterday.  Denies melena or hematochezia.  The history is provided by the patient.    Past Medical History:  Diagnosis Date  . Allergy   . Angioedema 05/04/2013  . Fibroid   . HSV-2 (herpes simplex virus  2) infection     Patient Active Problem List   Diagnosis Date Noted  . Uterine fibroid 09/13/2017  . HSV-2 (herpes simplex virus 2) infection     Past Surgical History:  Procedure Laterality Date  . TONSILLECTOMY       OB History   None      Home Medications    Prior to Admission medications   Medication Sig Start Date End Date Taking? Authorizing Provider  cetirizine (ZYRTEC) 10 MG tablet Take 1 tablet (10 mg total) by mouth daily. Patient taking differently: Take 10 mg by mouth daily as needed for allergies.  09/19/18   Tasia Catchings, Amy V, PA-C  dicyclomine (BENTYL) 10 MG capsule Take 1 capsule (10 mg total) by mouth 4 (four) times daily -  before meals and at bedtime. Patient not taking: Reported on 09/26/2018 09/01/18   Posey Boyer, MD  fluticasone [facility]) 50 MCG/ACT nasal spray Place 2 sprays into both nostrils daily. Patient not taking: Reported on 09/26/2018 09/19/18   Arturo Morton    Family History Family History  Problem Relation Age of Onset  . Diabetes Mother   . Hypertension Mother   . Diabetes Father   . Hypertension Father   . Asthma Brother     Social History Social History   Tobacco Use  . Smoking status: Never Smoker  . Smokeless tobacco: Never Used  Substance Use Topics  . Alcohol use:  Yes    Comment: occasionally  . Drug use: No     Allergies   Patient has no known allergies.   Review of Systems Review of Systems  Constitutional: Negative for fever.  HENT: Positive for congestion and ear pain. Negative for hearing loss.   Respiratory: Negative for shortness of breath.   Cardiovascular: Negative for chest pain.  Gastrointestinal: Positive for abdominal distention (chronic, unchanged). Negative for abdominal pain, nausea and vomiting.  Genitourinary: Negative for dysuria, frequency, hematuria, urgency, vaginal bleeding, vaginal discharge and vaginal pain.  All other systems reviewed and are negative.    Physical Exam Updated Vital  Signs BP (!) 145/93   Pulse 95   Temp 98.4 F (36.9 C) (Oral)   Resp 18   LMP 09/06/2018   SpO2 99%   Physical Exam  Constitutional: She appears well-developed and well-nourished. No distress.  appears anxious, resting in bed  HENT:  Head: Normocephalic and atraumatic.  Right Ear: External ear and ear canal normal. No drainage, swelling or tenderness. No mastoid tenderness. Tympanic membrane is not injected, not retracted and not bulging. A middle ear effusion is present. No hemotympanum.  Left Ear: External ear and ear canal normal. No swelling or tenderness. No mastoid tenderness. Tympanic membrane is not injected, not retracted and not bulging. A middle ear effusion is present. No hemotympanum.  Nose: Mucosal edema present. No septal deviation. Right sinus exhibits no maxillary sinus tenderness and no frontal sinus tenderness. Left sinus exhibits no maxillary sinus tenderness and no frontal sinus tenderness.  Mouth/Throat: Uvula is midline and oropharynx is clear and moist. No posterior oropharyngeal edema or posterior oropharyngeal erythema. No tonsillar exudate.  Eyes: Pupils are equal, round, and reactive to light. Conjunctivae and EOM are normal. Right eye exhibits no discharge. Left eye exhibits no discharge.  Neck: Normal range of motion. Neck supple. No JVD present. No neck rigidity. No tracheal deviation present.  Cardiovascular: Normal rate, regular rhythm and normal heart sounds.  Pulmonary/Chest: Effort normal.  Abdominal: Soft. Bowel sounds are normal. She exhibits no distension. There is no tenderness. There is no rigidity, no rebound, no guarding, no CVA tenderness, no tenderness at McBurney's point and negative Murphy's sign.  Abdomen appears somewhat full.  Some firmness to palpation in the suprapubic region consistent with known large uterine fibroid.  Genitourinary: There is no rash or lesion on the right labia. There is no rash or lesion on the left labia. Uterus is  enlarged. Uterus is not tender. Cervix exhibits no motion tenderness and no friability. Right adnexum displays no mass, no tenderness and no fullness. Left adnexum displays no mass, no tenderness and no fullness. Vaginal discharge found.  Genitourinary Comments: Examination performed in the presence of a chaperone.  No masses or lesions to the external genitalia.  Moderate amount of thick white discharge in the vaginal vault.  Musculoskeletal: She exhibits no edema.  Lymphadenopathy:    She has no cervical adenopathy.  Neurological: She is alert.  Skin: No erythema.  Psychiatric: She has a normal mood and affect. Her behavior is normal.  Nursing note and vitals reviewed.    ED Treatments / Results  Labs (all labs ordered are listed, but only abnormal results are displayed) Labs Reviewed  WET PREP, GENITAL - Abnormal; Notable for the following components:      Result Value   WBC, Wet Prep HPF POC FEW (*)    All other components within normal limits  POC URINE PREG, ED  EKG None  Radiology No results found.  Procedures Procedures (including critical care time)  Medications Ordered in ED Medications - No data to display   Initial Impression / Assessment and Plan / ED Course  I have reviewed the triage vital signs and the nursing notes.  Pertinent labs & imaging results that were available during my care of the patient were reviewed by me and considered in my medical decision making (see chart for details).     Patient presents with ongoing left ear pain and change in vaginal discharge.  She is afebrile, initially mildly tachycardic and hypertensive with improvement on reevaluation.  She did appear somewhat anxious.  No mastoid tenderness, swelling, or erythema on examination, no evidence of mastoiditis or malignant otitis externa.  She has some ongoing nasal congestion and middle ear effusions consistent with URI versus sinusitis.  Her symptoms are improving and she has  Artie been treated with antibiotics.  Encouraged her to continue with symptom Public affairs consultant.  With regards to her vaginal discharge, she has no abdominal pain on examination.  Her abdomen is soft though she does have an enlarged uterus secondary to a large fibroid.  She declines STI testing at this time which I think is reasonable as she had STD testing last month which was unremarkable and she has not had any new partners since then.  Wet prep obtained shows few WBCs but no concern for trichomonas, BV, or yeast.  Her examination is overall underwhelming.  There is no evidence of toxic shock syndrome in the absence of fever, rash, or pain.  No evidence of acute surgical abdominal pathology including obstruction, perforation, appendicitis, or colitis.  She has surgery scheduled for myomectomy removal later this month.  I offered the patient reassurance.  I encouraged her to avoid inserting any objects vaginally.  See no need for any additional emergent testing or imaging given her overall lack of symptoms and good follow up.  Discussed strict ED return precautions. Pt verbalized understanding of and agreement with plan and is safe for discharge home at this time.   Final Clinical Impressions(s) / ED Diagnoses   Final diagnoses:  Acute ear pain, left  Vaginal discharge    ED Discharge Orders    None       Renita Papa, PA-C 09/27/18 [place], MD 09/28/18 [phone]

## 2018-10-01 ENCOUNTER — Encounter (HOSPITAL_COMMUNITY)
Admission: RE | Admit: 2018-10-01 | Discharge: 2018-10-01 | Disposition: A | Discharge status: Home / Self Care | Payer: BLUE CROSS/BLUE SHIELD | Source: Ambulatory Visit | Attending: Obstetrics and Gynecology | Admitting: Obstetrics and Gynecology

## 2018-10-01 ENCOUNTER — Other Ambulatory Visit: Payer: Self-pay

## 2018-10-01 ENCOUNTER — Emergency Department (HOSPITAL_COMMUNITY): Payer: BLUE CROSS/BLUE SHIELD

## 2018-10-01 ENCOUNTER — Encounter (HOSPITAL_COMMUNITY): Payer: Self-pay

## 2018-10-01 ENCOUNTER — Emergency Department (HOSPITAL_COMMUNITY)
Admission: EM | Admit: 2018-10-01 | Discharge: 2018-10-01 | Disposition: A | Discharge status: Home / Self Care | Payer: BLUE CROSS/BLUE SHIELD | Attending: Emergency Medicine | Admitting: Emergency Medicine

## 2018-10-01 ENCOUNTER — Encounter (HOSPITAL_COMMUNITY): Payer: Self-pay | Admitting: *Deleted

## 2018-10-01 DIAGNOSIS — Z79899 Other long term (current) drug therapy: Secondary | ICD-10-CM | POA: Insufficient documentation

## 2018-10-01 DIAGNOSIS — H9202 Otalgia, left ear: Secondary | ICD-10-CM | POA: Diagnosis not present

## 2018-10-01 DIAGNOSIS — R519 Headache, unspecified: Secondary | ICD-10-CM

## 2018-10-01 DIAGNOSIS — R51 Headache: Secondary | ICD-10-CM | POA: Diagnosis present

## 2018-10-01 LAB — CBC
HCT: 41.6 % (ref 36.0–46.0)
Hemoglobin: 13.7 g/dL (ref 12.0–15.0)
MCH: 28.5 pg (ref 26.0–34.0)
MCHC: 32.9 g/dL (ref 30.0–36.0)
MCV: 86.5 fL (ref 80.0–100.0)
NRBC: 0 % (ref 0.0–0.2)
Platelets: 344 10*3/uL (ref 150–400)
RBC: 4.81 MIL/uL (ref 3.87–5.11)
RDW: 12.9 % (ref 11.5–15.5)
WBC: 9 10*3/uL (ref 4.0–10.5)

## 2018-10-01 LAB — ABO/RH: ABO/RH(D): O POS

## 2018-10-01 MED ORDER — TRAMADOL HCL 50 MG PO TABS: 50.0000 mg | ORAL_TABLET | Freq: Four times a day (QID) | ORAL | 0 refills | Status: DC | PRN

## 2018-10-01 MED ORDER — PREDNISONE 20 MG PO TABS: 40.0000 mg | ORAL_TABLET | Freq: Every day | ORAL | 0 refills | Status: DC

## 2018-10-01 NOTE — ED Triage Notes (Signed)
Pt complains of weakness, left sided head pain, tingling in lower spine, nausea. Pt was seen a few days ago in the ED but states she feels worse.

## 2018-10-01 NOTE — ED Notes (Signed)
Patient transported to CT 

## 2018-10-01 NOTE — ED Provider Notes (Signed)
[facility] DEPT Provider Note   CSN: [id number] Arrival date & time: 10/01/18  1214     History   Chief Complaint Chief Complaint  Patient presents with  . Nausea  . Headache  . Weakness    HPI Ruth Graham is a 27 y.o. female.  HPI Patient presents with a few different complaints.  Generalized weakness.  Left-sided headache and ear pain.  States began in the ear.  Also had sore throat.  States she is got seen at the hospital and told she had a viral infection.  States she continues to have pain.  States she does have some trouble hearing out of her left ear.  States she feels a little bad all over.  Is scheduled to have fibroids removed from her uterus.  No vaginal bleeding.  No confusion.  Headache is dull.  Also states that she has tingling in her spine. Past Medical History:  Diagnosis Date  . Allergy   . Angioedema 05/04/2013  . Fibroid   . HSV-2 (herpes simplex virus 2) infection     Patient Active Problem List   Diagnosis Date Noted  . Uterine fibroid 09/13/2017  . HSV-2 (herpes simplex virus 2) infection     Past Surgical History:  Procedure Laterality Date  . TONSILLECTOMY       OB History   None      Home Medications    Prior to Admission medications   Medication Sig Start Date End Date Taking? Authorizing Provider  cetirizine (ZYRTEC) 10 MG tablet Take 1 tablet (10 mg total) by mouth daily. Patient taking differently: Take 10 mg by mouth daily as needed for allergies.  09/19/18  Yes Yu, Amy V, PA-C  dicyclomine (BENTYL) 10 MG capsule Take 1 capsule (10 mg total) by mouth 4 (four) times daily -  before meals and at bedtime. Patient not taking: Reported on 09/26/2018 09/01/18   Posey Boyer, MD  fluticasone [facility]) 50 MCG/ACT nasal spray Place 2 sprays into both nostrils daily. Patient taking differently: Place 2 sprays into both nostrils daily as needed.  09/19/18   Tasia Catchings, Amy V, PA-C  predniSONE (DELTASONE) 20 MG  tablet Take 2 tablets (40 mg total) by mouth daily. 10/01/18   Davonna Belling, MD    Family History Family History  Problem Relation Age of Onset  . Diabetes Mother   . Hypertension Mother   . Diabetes Father   . Hypertension Father   . Asthma Brother     Social History Social History   Tobacco Use  . Smoking status: Never Smoker  . Smokeless tobacco: Never Used  Substance Use Topics  . Alcohol use: Yes    Comment: occasionally  . Drug use: No     Allergies   Patient has no known allergies.   Review of Systems Review of Systems  Constitutional: Negative for appetite change and fever.  HENT: Positive for ear pain.   Eyes: Negative for pain.  Respiratory: Negative for shortness of breath.   Cardiovascular: Negative for chest pain.  Gastrointestinal: Positive for abdominal pain.  Genitourinary: Negative for flank pain.  Musculoskeletal: Positive for back pain.  Skin: Negative for rash.  Neurological: Positive for headaches.  Hematological: Negative for adenopathy.  Psychiatric/Behavioral: Negative for confusion.     Physical Exam Updated Vital Signs BP (!) 149/94 (BP Location: Left Arm)   Pulse 92   Temp 98.3 F (36.8 C) (Oral)   Resp 18   LMP 09/06/2018 (Exact Date)  SpO2 99%   Physical Exam  Constitutional: She appears well-developed.  HENT:  Head: Normocephalic.  Mild posterior pharyngeal erythema without exudate.  Mild erythema bilaterally and external auditory canals.  Eyes: Pupils are equal, round, and reactive to light.  Neck: Neck supple.  Cardiovascular: Normal rate.  Pulmonary/Chest: Effort normal.  Abdominal: Soft. She exhibits mass.  Abdominal mass to well above umbilicus.  Musculoskeletal: She exhibits no edema.  Neurological: She is alert. She has normal strength.  Skin: Skin is warm.     ED Treatments / Results  Labs (all labs ordered are listed, but only abnormal results are displayed) Labs Reviewed - No data to  display  EKG None  Radiology Ct Head Wo Contrast  Result Date: 10/01/2018 CLINICAL DATA:  Headache for 3 weeks.  This is a new symptom. EXAM: CT HEAD WITHOUT CONTRAST TECHNIQUE: Contiguous axial images were obtained from the base of the skull through the vertex without intravenous contrast. COMPARISON:  None. FINDINGS: Brain: No evidence of acute infarction, hemorrhage, hydrocephalus, extra-axial collection or mass lesion/mass effect. Normal cerebral volume. No white matter disease. Vascular: No hyperdense vessel or unexpected calcification. Skull: Normal. Negative for fracture or focal lesion. Sinuses/Orbits: No acute finding. Other: None. IMPRESSION: Negative exam. Electronically Signed   By: Staci Righter M.D.   On: 10/01/2018 15:45    Procedures Procedures (including critical care time)  Medications Ordered in ED Medications - No data to display   Initial Impression / Assessment and Plan / ED Course  I have reviewed the triage vital signs and the nursing notes.  Pertinent labs & imaging results that were available during my care of the patient were reviewed by me and considered in my medical decision making (see chart for details).     Patient with headache left ear pain.  Well-appearing.  Recent work-up for same.  Head CT added due to severe pain persistent of symptoms.  Head CT reassuring.  Will treat with short course of steroids to see if this will help.  Has fibroid due to be taken out soon.  Discharge home.  Final Clinical Impressions(s) / ED Diagnoses   Final diagnoses:  Nonintractable headache, unspecified chronicity pattern, unspecified headache type  Ear pain, left    ED Discharge Orders         Ordered    predniSONE (DELTASONE) 20 MG tablet  Daily     10/01/18 1601           Davonna Belling, MD 10/01/18 1601

## 2018-10-06 ENCOUNTER — Emergency Department (HOSPITAL_COMMUNITY)
Admission: EM | Admit: 2018-10-06 | Discharge: 2018-10-06 | Disposition: A | Discharge status: Home / Self Care | Payer: BLUE CROSS/BLUE SHIELD | Attending: Emergency Medicine | Admitting: Emergency Medicine

## 2018-10-06 DIAGNOSIS — N898 Other specified noninflammatory disorders of vagina: Secondary | ICD-10-CM | POA: Insufficient documentation

## 2018-10-06 LAB — CBC WITH DIFFERENTIAL/PLATELET
ABS IMMATURE GRANULOCYTES: 0.03 10*3/uL (ref 0.00–0.07)
Basophils Absolute: 0.1 10*3/uL (ref 0.0–0.1)
Basophils Relative: 1 %
EOS ABS: 0.1 10*3/uL (ref 0.0–0.5)
Eosinophils Relative: 1 %
HEMATOCRIT: 40.8 % (ref 36.0–46.0)
HEMOGLOBIN: 13 g/dL (ref 12.0–15.0)
IMMATURE GRANULOCYTES: 0 %
Lymphocytes Relative: 25 %
Lymphs Abs: 1.7 10*3/uL (ref 0.7–4.0)
MCH: 28.1 pg (ref 26.0–34.0)
MCHC: 31.9 g/dL (ref 30.0–36.0)
MCV: 88.3 fL (ref 80.0–100.0)
MONO ABS: 0.6 10*3/uL (ref 0.1–1.0)
MONOS PCT: 9 %
NEUTROS ABS: 4.4 10*3/uL (ref 1.7–7.7)
Neutrophils Relative %: 64 %
Platelets: 293 10*3/uL (ref 150–400)
RBC: 4.62 MIL/uL (ref 3.87–5.11)
RDW: 12.4 % (ref 11.5–15.5)
WBC: 6.9 10*3/uL (ref 4.0–10.5)
nRBC: 0 % (ref 0.0–0.2)

## 2018-10-06 LAB — BASIC METABOLIC PANEL
Anion gap: 8 (ref 5–15)
BUN: 6 mg/dL (ref 6–20)
CALCIUM: 9.2 mg/dL (ref 8.9–10.3)
CO2: 28 mmol/L (ref 22–32)
Chloride: 102 mmol/L (ref 98–111)
Creatinine, Ser: 0.84 mg/dL (ref 0.44–1.00)
GFR calc Af Amer: >60 mL/min (ref >60)
GLUCOSE: 110 mg/dL — AB (ref 70–99)
Potassium: 3.7 mmol/L (ref 3.5–5.1)
Sodium: 138 mmol/L (ref 135–145)

## 2018-10-06 LAB — URINALYSIS, ROUTINE W REFLEX MICROSCOPIC
Bacteria, UA: NONE SEEN
Bilirubin Urine: NEGATIVE
GLUCOSE, UA: NEGATIVE mg/dL
Ketones, ur: NEGATIVE mg/dL
Leukocytes, UA: NEGATIVE
Nitrite: NEGATIVE
Protein, ur: NEGATIVE mg/dL
SPECIFIC GRAVITY, URINE: 1.021 (ref 1.005–1.030)
pH: 6 (ref 5.0–8.0)

## 2018-10-06 LAB — WET PREP, GENITAL
CLUE CELLS WET PREP: NONE SEEN
Sperm: NONE SEEN
Trich, Wet Prep: NONE SEEN
Yeast Wet Prep HPF POC: NONE SEEN

## 2018-10-06 LAB — PREGNANCY, URINE: PREG TEST UR: NEGATIVE

## 2018-10-06 MED ORDER — SODIUM CHLORIDE 0.9 % IV BOLUS
1000.0000 mL | Freq: Once | INTRAVENOUS | Status: AC
  Administered 2018-10-06: 1000 mL via INTRAVENOUS

## 2018-10-06 NOTE — ED Triage Notes (Signed)
Pt reports she recently tried using vaginal detox pearls and has been having issues since. Pt reports a strong odor from her vagina and a slight burning sensation when she pees.  Pt denies possibility of pregnancy

## 2018-10-06 NOTE — ED Provider Notes (Signed)
[facility] DEPT Provider Note   CSN: [id number] Arrival date & time: 10/06/18  1937     History   Chief Complaint Chief Complaint  Patient presents with  . Dysuria  . Vaginal Odor    HPI Ruth Graham is a 27 y.o. female.  Patient is a 27 year old female with past medical history of fibroids, angioedema.  She presents today for evaluation of vaginal discharge and odor.  This is been ongoing for the past several weeks.  1 week ago, she tried using an over-the-counter vaginal detox Pearl.  Since then she has felt worse and is concerned she may have "toxins in her body".  She feels as though her sweat is thick and feels weak.  She denies any fevers or chills.  She is sexually active with one partner but reports using condoms.  Last menstrual period is finishing now and she denies the possibility of pregnancy.  She does report some dysuria over the past few days.  The history is provided by the patient.    Past Medical History:  Diagnosis Date  . Allergy   . Angioedema 05/04/2013  . Fibroid   . HSV-2 (herpes simplex virus 2) infection     Patient Active Problem List   Diagnosis Date Noted  . Uterine fibroid 09/13/2017  . HSV-2 (herpes simplex virus 2) infection     Past Surgical History:  Procedure Laterality Date  . TONSILLECTOMY       OB History   None      Home Medications    Prior to Admission medications   Medication Sig Start Date End Date Taking? Authorizing Provider  cetirizine (ZYRTEC) 10 MG tablet Take 1 tablet (10 mg total) by mouth daily. Patient taking differently: Take 10 mg by mouth daily as needed for allergies.  09/19/18   Tasia Catchings, Amy V, PA-C  dicyclomine (BENTYL) 10 MG capsule Take 1 capsule (10 mg total) by mouth 4 (four) times daily -  before meals and at bedtime. Patient not taking: Reported on 09/26/2018 09/01/18   Posey Boyer, MD  fluticasone [facility]) 50 MCG/ACT nasal spray Place 2 sprays into both nostrils  daily. Patient taking differently: Place 2 sprays into both nostrils daily as needed.  09/19/18   Tasia Catchings, Amy V, PA-C  predniSONE (DELTASONE) 20 MG tablet Take 2 tablets (40 mg total) by mouth daily. 10/01/18   Davonna Belling, MD  traMADol (ULTRAM) 50 MG tablet Take 1 tablet (50 mg total) by mouth every 6 (six) hours as needed. 10/01/18   Tegeler, Gwenyth Allegra, MD    Family History Family History  Problem Relation Age of Onset  . Diabetes Mother   . Hypertension Mother   . Diabetes Father   . Hypertension Father   . Asthma Brother     Social History Social History   Tobacco Use  . Smoking status: Never Smoker  . Smokeless tobacco: Never Used  Substance Use Topics  . Alcohol use: Yes    Comment: occasionally  . Drug use: No     Allergies   Patient has no known allergies.   Review of Systems Review of Systems  All other systems reviewed and are negative.    Physical Exam Updated Vital Signs BP (!) 138/92 (BP Location: Left Arm)   Pulse 95   Temp 98.9 F (37.2 C) (Oral)   Resp 18   Ht 5\' 2"  (1.575 m)   Wt 85.3 kg   LMP 08/26/2018 (Exact Date)   SpO2  100%   BMI 34.39 kg/m   Physical Exam  Constitutional: She is oriented to person, place, and time. She appears well-developed and well-nourished. No distress.  HENT:  Head: Normocephalic and atraumatic.  Neck: Normal range of motion. Neck supple.  Cardiovascular: Normal rate and regular rhythm. Exam reveals no gallop and no friction rub.  No murmur heard. Pulmonary/Chest: Effort normal and breath sounds normal. No respiratory distress. She has no wheezes.  Abdominal: Soft. Bowel sounds are normal. She exhibits no distension. There is no tenderness.  Genitourinary:  Genitourinary Comments: There is slight dark blood in the vaginal vault, however exam is otherwise unremarkable.  There is no cervical motion tenderness and there are no adnexal masses.  There is no significant discharge.  Musculoskeletal: Normal range  of motion.  Neurological: She is alert and oriented to person, place, and time.  Skin: Skin is warm and dry. She is not diaphoretic.  Nursing note and vitals reviewed.    ED Treatments / Results  Labs (all labs ordered are listed, but only abnormal results are displayed) Labs Reviewed  WET PREP, GENITAL  BASIC METABOLIC PANEL  CBC WITH DIFFERENTIAL/PLATELET  URINALYSIS, ROUTINE W REFLEX MICROSCOPIC  PREGNANCY, URINE  GC/CHLAMYDIA PROBE AMP ([place]) NOT AT [facility]    EKG None  Radiology No results found.  Procedures Procedures (including critical care time)  Medications Ordered in ED Medications - No data to display   Initial Impression / Assessment and Plan / ED Course  I have reviewed the triage vital signs and the nursing notes.  Pertinent labs & imaging results that were available during my care of the patient were reviewed by me and considered in my medical decision making (see chart for details).  Patient presenting with complaints of vaginal discharge and odor.  She reports using vaginal detox pearls that she purchased over-the-counter and is concerned that these may have poisoned her.  Patient appears clinically well with stable vital signs.  Her work-up shows normal blood counts and electrolytes, wet prep which is essentially unremarkable, and urinalysis which is clear with negative pregnancy test.  At this point, I do not feel as though any further work-up is indicated.  I highly doubt that she has been "poisoned" by the vaginal pearls she reports using, however she is insistent upon this.  I offered her reassurance and advised her to follow-up with her primary doctor if she is not improving.  Final Clinical Impressions(s) / ED Diagnoses   Final diagnoses:  None    ED Discharge Orders    None       Veryl Speak, MD 10/06/18 1037

## 2018-10-06 NOTE — Discharge Instructions (Addendum)
We will contact you if your cultures indicate you require further treatment or action.  Follow-up with your primary doctor if not improving in the next 2 to 3 days, and return to the ER if your symptoms significantly worsen or change.

## 2018-10-07 LAB — GC/CHLAMYDIA PROBE AMP (~~LOC~~) NOT AT ARMC
CHLAMYDIA, DNA PROBE: NEGATIVE
Neisseria Gonorrhea: NEGATIVE

## 2018-10-11 ENCOUNTER — Other Ambulatory Visit: Payer: Self-pay

## 2018-10-11 ENCOUNTER — Emergency Department (HOSPITAL_COMMUNITY): Payer: BLUE CROSS/BLUE SHIELD

## 2018-10-11 ENCOUNTER — Emergency Department (HOSPITAL_COMMUNITY)
Admission: EM | Admit: 2018-10-11 | Discharge: 2018-10-11 | Disposition: A | Discharge status: Home / Self Care | Payer: BLUE CROSS/BLUE SHIELD | Attending: Emergency Medicine | Admitting: Emergency Medicine

## 2018-10-11 DIAGNOSIS — G8929 Other chronic pain: Secondary | ICD-10-CM | POA: Insufficient documentation

## 2018-10-11 DIAGNOSIS — M545 Low back pain: Secondary | ICD-10-CM | POA: Insufficient documentation

## 2018-10-11 LAB — URINALYSIS, ROUTINE W REFLEX MICROSCOPIC
BILIRUBIN URINE: NEGATIVE
Bacteria, UA: NONE SEEN
GLUCOSE, UA: NEGATIVE mg/dL
KETONES UR: NEGATIVE mg/dL
Leukocytes, UA: NEGATIVE
NITRITE: NEGATIVE
PH: 5 (ref 5.0–8.0)
Protein, ur: NEGATIVE mg/dL
Specific Gravity, Urine: 1.017 (ref 1.005–1.030)

## 2018-10-11 LAB — I-STAT BETA HCG BLOOD, ED (MC, WL, AP ONLY): I-stat hCG, quantitative: <5 m[IU]/mL (ref <5)

## 2018-10-11 MED ORDER — IBUPROFEN 800 MG PO TABS: 800.0000 mg | ORAL_TABLET | Freq: Three times a day (TID) | ORAL | 0 refills | Status: DC

## 2018-10-11 NOTE — ED Notes (Signed)
Provider at bedside at this time

## 2018-10-11 NOTE — ED Notes (Signed)
Pt returned to room from Radiology at this time.

## 2018-10-11 NOTE — Discharge Instructions (Addendum)
1. Medications: usual home medications 2. Treatment: rest, drink plenty of fluids, gentle stretching as discussed, alternate ice and heat 3. Follow Up: Please followup with your primary doctor in 3 days for discussion of your diagnoses and further evaluation after today's visit; if you do not have a primary care doctor use the resource guide provided to find one;  Return to the ER for worsening back pain, difficulty walking, loss of bowel or bladder control or other concerning symptoms.  Please obtain all of your results from medical records or have your doctors office obtain the results - share them with your doctor - you should be seen at your doctors office in the next 2 days. Call today to arrange your follow up. Take the medications as prescribed. Please review all of the medicines and only take them if you do not have an allergy to them. Please be aware that if you are taking birth control pills, taking other prescriptions, ESPECIALLY ANTIBIOTICS may make the birth control ineffective - if this is the case, either do not engage in sexual activity or use alternative methods of birth control such as condoms until you have finished the medicine and your family doctor says it is OK to restart them. If you are on a blood thinner such as COUMADIN, be aware that any other medicine that you take may cause the coumadin to either work too much, or not enough - you should have your coumadin level rechecked in next 7 days if this is the case.  ?  It is also a possibility that you have an allergic reaction to any of the medicines that you have been prescribed - Everybody reacts differently to medications and while MOST people have no trouble with most medicines, you may have a reaction such as nausea, vomiting, rash, swelling, shortness of breath. If this is the case, please stop taking the medicine immediately and contact your physician.  ?  You should return to the ER if you develop severe or worsening symptoms.

## 2018-10-11 NOTE — ED Notes (Signed)
Pt Ambulatory to restroom without difficulty at this time.

## 2018-10-11 NOTE — ED Provider Notes (Signed)
[facility] DEPT Provider Note   CSN: [id number] Arrival date & time: 10/11/18  0750     History   Chief Complaint Chief Complaint  Patient presents with  . Back Pain    HPI Ruth Graham is a 27 y.o. female presents with gradual constant non radiating left sided low back pain onset 1 month ago. Patient describes pain as an ache/burn and states it is currently a 3/10. Patient reports numbness in the middle of her back, but denies numbness radiating down her legs. Denies tingling, weakness, incontinence to bowel/bladder, fever, chills, IV drug use, or hx of cancer. Patient states ibuprofen has helped her back pain and laying on her side makes the pain worse. Patient reports back pain began after taking, "Detox Beads" for vaginal pH restoration. Patient states she has taken these before, but she took 3 times as much this time. Patient states she had multiple symptoms a month ago when she placed the beads including diffuse weakness, headaches, and diarrhea. Patient denies any vaginal discharge or bleeding. Patient denies dysuria, frequency, abdominal pain, nausea, or vomiting. Patient denies any pain with ambulation.   HPI  Past Medical History:  Diagnosis Date  . Allergy   . Angioedema 05/04/2013  . Fibroid   . HSV-2 (herpes simplex virus 2) infection     Patient Active Problem List   Diagnosis Date Noted  . Uterine fibroid 09/13/2017  . HSV-2 (herpes simplex virus 2) infection     Past Surgical History:  Procedure Laterality Date  . TONSILLECTOMY       OB History   None      Home Medications    Prior to Admission medications   Medication Sig Start Date End Date Taking? Authorizing Provider  cetirizine (ZYRTEC) 10 MG tablet Take 1 tablet (10 mg total) by mouth daily. Patient taking differently: Take 10 mg by mouth daily as needed for allergies.  09/19/18  Yes Yu, Amy V, PA-C  fluticasone (FLONASE) 50 MCG/ACT nasal spray Place 2 sprays  into both nostrils daily. Patient taking differently: Place 2 sprays into both nostrils daily as needed.  09/19/18  Yes Yu, Amy V, PA-C  predniSONE (DELTASONE) 20 MG tablet Take 2 tablets (40 mg total) by mouth daily. 10/01/18  Yes Davonna Belling, MD  traMADol (ULTRAM) 50 MG tablet Take 1 tablet (50 mg total) by mouth every 6 (six) hours as needed. 10/01/18  Yes Tegeler, Gwenyth Allegra, MD  dicyclomine (BENTYL) 10 MG capsule Take 1 capsule (10 mg total) by mouth 4 (four) times daily -  before meals and at bedtime. Patient not taking: Reported on 09/26/2018 09/01/18   Posey Boyer, MD    Family History Family History  Problem Relation Age of Onset  . Diabetes Mother   . Hypertension Mother   . Diabetes Father   . Hypertension Father   . Asthma Brother     Social History Social History   Tobacco Use  . Smoking status: Never Smoker  . Smokeless tobacco: Never Used  Substance Use Topics  . Alcohol use: Yes    Comment: occasionally  . Drug use: No     Allergies   Other   Review of Systems Review of Systems  Constitutional: Negative for activity change, chills, diaphoresis, fever and unexpected weight change.  Respiratory: Negative for cough and shortness of breath.   Cardiovascular: Negative for chest pain, palpitations and leg swelling.  Gastrointestinal: Negative for abdominal pain, constipation, diarrhea, nausea and vomiting.  Genitourinary:  Negative for difficulty urinating, dysuria, flank pain and hematuria.  Musculoskeletal: Positive for back pain. Negative for arthralgias, gait problem, joint swelling, myalgias, neck pain and neck stiffness.  Skin: Negative for rash.  Allergic/Immunologic: Negative for immunocompromised state.  Neurological: Negative for dizziness, syncope, weakness and numbness.  Hematological: Does not bruise/bleed easily.     Physical Exam Updated Vital Signs BP 120/83 (BP Location: Left Arm)   Pulse 74   Temp 99.1 F (37.3 C) (Oral)    Resp 14   Ht 5\' 2"  (1.575 m)   Wt 85.3 kg   LMP 10/02/2018 (Approximate)   SpO2 99%   BMI 34.39 kg/m   Physical Exam  Constitutional: She appears well-developed and well-nourished. No distress.  HENT:  Head: Normocephalic and atraumatic.  Neck: Normal range of motion.  Cardiovascular: Normal rate, regular rhythm and normal heart sounds. Exam reveals no gallop and no friction rub.  No murmur heard. Pulmonary/Chest: Effort normal and breath sounds normal. No respiratory distress. She has no wheezes. She has no rales.  Abdominal: Soft. She exhibits no distension. There is no tenderness. There is no guarding.  Musculoskeletal: Normal range of motion.       Thoracic back: Normal.       Lumbar back: She exhibits tenderness (Mild tenderness on the left lower back on palpation. No midline tenderness noted.). She exhibits normal range of motion, no bony tenderness, no swelling, no edema, no deformity and no laceration.  Patient is able to ambulate without difficulty. Patient is able to ambulate on heels and toes without difficulty. No sensory deficits noted. Full ROM of spine without pain. 5/5 strength in lower extremities with dorsiflexion/plantar flexion. Negative straight leg test. Distal pulses palpable.  Neurological: She is alert.  Skin: Skin is warm. No rash noted. She is not diaphoretic. No erythema.  Psychiatric: She has a normal mood and affect.  Nursing note and vitals reviewed.   ED Treatments / Results  Labs (all labs ordered are listed, but only abnormal results are displayed) Labs Reviewed  URINALYSIS, ROUTINE W REFLEX MICROSCOPIC - Abnormal; Notable for the following components:      Result Value   APPearance HAZY (*)    Hgb urine dipstick MODERATE (*)    All other components within normal limits  I-STAT BETA HCG BLOOD, ED ([facility], [facility], [facility] ONLY)    EKG None  Radiology Dg Lumbar Spine Complete  Result Date: 10/11/2018 CLINICAL DATA:  Low back pain for several weeks, no  known injury, initial encounter EXAM: LUMBAR SPINE - COMPLETE 4+ VIEW COMPARISON:  None. FINDINGS: There is no evidence of lumbar spine fracture. Alignment is normal. Intervertebral disc spaces are maintained. IMPRESSION: No acute abnormality noted. Electronically Signed   By: Inez Catalina M.D.   On: 10/11/2018 10:23    Procedures Procedures (including critical care time)  Medications Ordered in ED Medications - No data to display   Initial Impression / Assessment and Plan / ED Course  I have reviewed the triage vital signs and the nursing notes.  Pertinent labs & imaging results that were available during my care of the patient were reviewed by me and considered in my medical decision making (see chart for details).  Clinical Course as of Oct 11 1114  Sat Oct 11, 2018  9937 Suspect Hgb is elevated due to fibroids.   Hgb urine dipstickMarland Kitchen): MODERATE [AH]    Clinical Course User Index [AH] Arville Lime, PA-C    Patient presents with complaint of back  pain. Patient nontoxic appearing, in no apparent distress, vitals WNL, stable.  Labs:  Ordered pregnancy test prior to x ray and UA to evaluate for a urinary tract infection.   Imaging: Ordered lumbar x ray to evaluate for acute bony abnormalities. No acute abnormalities noted on lumbar x ray.   Assessment/Plan: Patient with back pain.  No neurological deficits and normal neuro exam.  Patient can walk but states is painful.  No loss of bowel or bladder control.  No concern for cauda equina.  No fever, night sweats, weight loss, h/o cancer, IVDU.  RICE protocol indicated and discussed with patient. Did not prescribe ibuprofen for back pain due to upcoming Myomectomy on 11/19. Discussed this decision with patient and she understands.  Doubt need for further emergent work up at this time. I discussed results, treatment plan, need for PCP follow-up, and return precautions to return to the ER including for any other new or worsening symptoms  with the patient. Provided opportunity for questions, patient confirmed understanding and is in agreement with plan. I have answered their questions. Discharge instructions concerning home care and prescriptions have been given. The patient is STABLE and is discharged to home in good condition. Encouraged patient to follow up with PCP and have PCP obtain results of this visit in 7 days or sooner if needed.    Final Clinical Impressions(s) / ED Diagnoses   Final diagnoses:  Chronic left-sided low back pain without sciatica    ED Discharge Orders         Ordered    ibuprofen (ADVIL,MOTRIN) 800 MG tablet  3 times daily,   Status:  Discontinued     10/11/18 [place], Raji Glinski [place], [place] 10/11/18 1115    Carmin Muskrat, MD 10/11/18 [phone]

## 2018-10-11 NOTE — ED Triage Notes (Signed)
Pt presents to ER complaining of left sided back pain x 1 month. Pt states pain increases when she breathes. Pt denies SOB or swelling. Pt is ambulatory without difficulty. Pt denies loss of control of urine and bowels.

## 2018-10-14 ENCOUNTER — Inpatient Hospital Stay (HOSPITAL_COMMUNITY): Payer: BLUE CROSS/BLUE SHIELD | Admitting: Anesthesiology

## 2018-10-14 ENCOUNTER — Inpatient Hospital Stay (HOSPITAL_COMMUNITY)
Admission: AD | Admit: 2018-10-14 | Discharge: 2018-10-18 | DRG: 742 | Disposition: A | Discharge status: Home / Self Care | Payer: BLUE CROSS/BLUE SHIELD | Attending: Obstetrics and Gynecology | Admitting: Obstetrics and Gynecology

## 2018-10-14 ENCOUNTER — Encounter (HOSPITAL_COMMUNITY)
Admission: AD | Disposition: A | Discharge status: Home / Self Care | Payer: Self-pay | Source: Home / Self Care | Attending: Obstetrics and Gynecology

## 2018-10-14 ENCOUNTER — Other Ambulatory Visit: Payer: Self-pay

## 2018-10-14 ENCOUNTER — Encounter (HOSPITAL_COMMUNITY): Payer: Self-pay

## 2018-10-14 DIAGNOSIS — D259 Leiomyoma of uterus, unspecified: Principal | ICD-10-CM | POA: Diagnosis present

## 2018-10-14 DIAGNOSIS — D500 Iron deficiency anemia secondary to blood loss (chronic): Secondary | ICD-10-CM | POA: Diagnosis present

## 2018-10-14 DIAGNOSIS — K567 Ileus, unspecified: Secondary | ICD-10-CM | POA: Diagnosis not present

## 2018-10-14 DIAGNOSIS — K9189 Other postprocedural complications and disorders of digestive system: Secondary | ICD-10-CM | POA: Diagnosis not present

## 2018-10-14 DIAGNOSIS — Z6833 Body mass index (BMI) 33.0-33.9, adult: Secondary | ICD-10-CM | POA: Diagnosis not present

## 2018-10-14 HISTORY — PX: MYOMECTOMY: SHX85

## 2018-10-14 LAB — PREGNANCY, URINE: PREG TEST UR: NEGATIVE

## 2018-10-14 LAB — PREPARE RBC (CROSSMATCH)

## 2018-10-14 SURGERY — MYOMECTOMY, ABDOMINAL APPROACH
Anesthesia: General | Site: Abdomen

## 2018-10-14 MED ORDER — DOCUSATE SODIUM 100 MG PO CAPS
100.0000 mg | ORAL_CAPSULE | Freq: Two times a day (BID) | ORAL | Status: DC
  Administered 2018-10-15 – 2018-10-18 (×7): 100 mg via ORAL
  Filled 2018-10-14 (×8): qty 1

## 2018-10-14 MED ORDER — ROCURONIUM BROMIDE 100 MG/10ML IV SOLN
INTRAVENOUS | Status: DC | PRN
  Administered 2018-10-14: 35 mg via INTRAVENOUS
  Administered 2018-10-14: 20 mg via INTRAVENOUS
  Administered 2018-10-14 (×3): 10 mg via INTRAVENOUS
  Administered 2018-10-14: 5 mg via INTRAVENOUS

## 2018-10-14 MED ORDER — ONDANSETRON HCL 4 MG/2ML IJ SOLN
4.0000 mg | Freq: Once | INTRAMUSCULAR | Status: AC | PRN
  Administered 2018-10-14: 4 mg via INTRAVENOUS

## 2018-10-14 MED ORDER — METHYLENE BLUE 0.5 % INJ SOLN
INTRAVENOUS | Status: AC
  Filled 2018-10-14: qty 10

## 2018-10-14 MED ORDER — ACETAMINOPHEN 10 MG/ML IV SOLN
1000.0000 mg | Freq: Once | INTRAVENOUS | Status: AC
  Administered 2018-10-14: 1000 mg via INTRAVENOUS

## 2018-10-14 MED ORDER — ONDANSETRON HCL 4 MG/2ML IJ SOLN: 4.0000 mg | Freq: Four times a day (QID) | INTRAMUSCULAR | Status: DC | PRN

## 2018-10-14 MED ORDER — SCOPOLAMINE 1 MG/3DAYS TD PT72
MEDICATED_PATCH | TRANSDERMAL | Status: AC
  Administered 2018-10-14: 1.5 mg via TRANSDERMAL
  Filled 2018-10-14: qty 1

## 2018-10-14 MED ORDER — INFLUENZA VAC SPLIT QUAD 0.5 ML IM SUSY: 0.5000 mL | PREFILLED_SYRINGE | INTRAMUSCULAR | Status: DC

## 2018-10-14 MED ORDER — ONDANSETRON HCL 4 MG/2ML IJ SOLN
INTRAMUSCULAR | Status: DC | PRN
  Administered 2018-10-14: 4 mg via INTRAVENOUS

## 2018-10-14 MED ORDER — OXYCODONE HCL 5 MG PO TABS: 5.0000 mg | ORAL_TABLET | Freq: Once | ORAL | Status: DC | PRN

## 2018-10-14 MED ORDER — OXYCODONE-ACETAMINOPHEN 5-325 MG PO TABS
1.0000 | ORAL_TABLET | Freq: Four times a day (QID) | ORAL | Status: DC | PRN
  Administered 2018-10-15: 2 via ORAL
  Administered 2018-10-15 – 2018-10-17 (×5): 1 via ORAL
  Filled 2018-10-14: qty 2
  Filled 2018-10-14 (×2): qty 1
  Filled 2018-10-14: qty 2
  Filled 2018-10-14 (×3): qty 1

## 2018-10-14 MED ORDER — SODIUM CHLORIDE 0.9% FLUSH: 9.0000 mL | INTRAVENOUS | Status: DC | PRN

## 2018-10-14 MED ORDER — LACTATED RINGERS IV SOLN
INTRAVENOUS | Status: DC
  Administered 2018-10-14: 12:00:00 via INTRAVENOUS

## 2018-10-14 MED ORDER — VASOPRESSIN 20 UNIT/ML IV SOLN
INTRAVENOUS | Status: AC
  Filled 2018-10-14: qty 1

## 2018-10-14 MED ORDER — HYDROMORPHONE HCL 1 MG/ML IJ SOLN
INTRAMUSCULAR | Status: AC
  Filled 2018-10-14: qty 1

## 2018-10-14 MED ORDER — FENTANYL CITRATE (PF) 250 MCG/5ML IJ SOLN
INTRAMUSCULAR | Status: AC
  Filled 2018-10-14: qty 5

## 2018-10-14 MED ORDER — SUCCINYLCHOLINE CHLORIDE 20 MG/ML IJ SOLN
INTRAMUSCULAR | Status: DC | PRN
  Administered 2018-10-14: 120 mg via INTRAVENOUS

## 2018-10-14 MED ORDER — LIDOCAINE HCL (CARDIAC) PF 100 MG/5ML IV SOSY
PREFILLED_SYRINGE | INTRAVENOUS | Status: AC
  Filled 2018-10-14: qty 5

## 2018-10-14 MED ORDER — CEFAZOLIN SODIUM-DEXTROSE 2-4 GM/100ML-% IV SOLN
2.0000 g | INTRAVENOUS | Status: AC
  Administered 2018-10-14: 2 g via INTRAVENOUS

## 2018-10-14 MED ORDER — KETOROLAC TROMETHAMINE 30 MG/ML IJ SOLN
INTRAMUSCULAR | Status: AC
  Filled 2018-10-14: qty 1

## 2018-10-14 MED ORDER — PROPOFOL 10 MG/ML IV BOLUS
INTRAVENOUS | Status: AC
  Filled 2018-10-14: qty 20

## 2018-10-14 MED ORDER — HYDROMORPHONE 1 MG/ML IV SOLN
INTRAVENOUS | Status: DC
  Administered 2018-10-14: 30 mg via INTRAVENOUS
  Administered 2018-10-14: 1.8 mg via INTRAVENOUS
  Administered 2018-10-15 (×2): 1.2 mg via INTRAVENOUS
  Filled 2018-10-14: qty 30

## 2018-10-14 MED ORDER — HYDROMORPHONE HCL 1 MG/ML IJ SOLN
0.2500 mg | INTRAMUSCULAR | Status: DC | PRN
  Administered 2018-10-14 (×3): 0.5 mg via INTRAVENOUS

## 2018-10-14 MED ORDER — PROPOFOL 10 MG/ML IV BOLUS
INTRAVENOUS | Status: DC | PRN
  Administered 2018-10-14: 200 mg via INTRAVENOUS

## 2018-10-14 MED ORDER — ACETAMINOPHEN 325 MG PO TABS: 325.0000 mg | ORAL_TABLET | ORAL | Status: DC | PRN

## 2018-10-14 MED ORDER — IBUPROFEN 600 MG PO TABS: 600.0000 mg | ORAL_TABLET | Freq: Four times a day (QID) | ORAL | Status: DC | PRN

## 2018-10-14 MED ORDER — MIDAZOLAM HCL 2 MG/2ML IJ SOLN
INTRAMUSCULAR | Status: DC | PRN
  Administered 2018-10-14: 2 mg via INTRAVENOUS

## 2018-10-14 MED ORDER — DIPHENHYDRAMINE HCL 12.5 MG/5ML PO ELIX: 12.5000 mg | ORAL_SOLUTION | Freq: Four times a day (QID) | ORAL | Status: DC | PRN

## 2018-10-14 MED ORDER — ACETAMINOPHEN 10 MG/ML IV SOLN
INTRAVENOUS | Status: AC
  Filled 2018-10-14: qty 100

## 2018-10-14 MED ORDER — SUGAMMADEX SODIUM 200 MG/2ML IV SOLN
INTRAVENOUS | Status: AC
  Filled 2018-10-14: qty 2

## 2018-10-14 MED ORDER — DIPHENHYDRAMINE HCL 50 MG/ML IJ SOLN: 12.5000 mg | Freq: Four times a day (QID) | INTRAMUSCULAR | Status: DC | PRN

## 2018-10-14 MED ORDER — SUGAMMADEX SODIUM 200 MG/2ML IV SOLN
INTRAVENOUS | Status: DC | PRN
  Administered 2018-10-14: 150 mg via INTRAVENOUS

## 2018-10-14 MED ORDER — SCOPOLAMINE 1 MG/3DAYS TD PT72
1.0000 | MEDICATED_PATCH | Freq: Once | TRANSDERMAL | Status: DC
  Administered 2018-10-14: 1.5 mg via TRANSDERMAL

## 2018-10-14 MED ORDER — LIDOCAINE HCL (CARDIAC) PF 100 MG/5ML IV SOSY
PREFILLED_SYRINGE | INTRAVENOUS | Status: DC | PRN
  Administered 2018-10-14: 50 mg via INTRAVENOUS

## 2018-10-14 MED ORDER — ACETAMINOPHEN 160 MG/5ML PO SOLN: 325.0000 mg | ORAL | Status: DC | PRN

## 2018-10-14 MED ORDER — MIDAZOLAM HCL 2 MG/2ML IJ SOLN
INTRAMUSCULAR | Status: AC
  Filled 2018-10-14: qty 2

## 2018-10-14 MED ORDER — SODIUM CHLORIDE (PF) 0.9 % IJ SOLN
INTRAMUSCULAR | Status: AC
  Filled 2018-10-14: qty 100

## 2018-10-14 MED ORDER — MEPERIDINE HCL 25 MG/ML IJ SOLN: 6.2500 mg | INTRAMUSCULAR | Status: DC | PRN

## 2018-10-14 MED ORDER — FENTANYL CITRATE (PF) 100 MCG/2ML IJ SOLN: 25.0000 ug | INTRAMUSCULAR | Status: DC | PRN

## 2018-10-14 MED ORDER — FENTANYL CITRATE (PF) 100 MCG/2ML IJ SOLN
INTRAMUSCULAR | Status: DC | PRN
  Administered 2018-10-14 (×5): 50 ug via INTRAVENOUS

## 2018-10-14 MED ORDER — HYDROMORPHONE HCL 1 MG/ML IJ SOLN
INTRAMUSCULAR | Status: AC
  Filled 2018-10-14: qty 0.5

## 2018-10-14 MED ORDER — SUCCINYLCHOLINE CHLORIDE 200 MG/10ML IV SOSY
PREFILLED_SYRINGE | INTRAVENOUS | Status: AC
  Filled 2018-10-14: qty 10

## 2018-10-14 MED ORDER — ONDANSETRON HCL 4 MG/2ML IJ SOLN
INTRAMUSCULAR | Status: AC
  Filled 2018-10-14: qty 2

## 2018-10-14 MED ORDER — ONDANSETRON HCL 4 MG PO TABS
4.0000 mg | ORAL_TABLET | Freq: Three times a day (TID) | ORAL | Status: DC | PRN
  Administered 2018-10-15 – 2018-10-17 (×4): 4 mg via ORAL
  Filled 2018-10-14 (×4): qty 1

## 2018-10-14 MED ORDER — OXYCODONE HCL 5 MG/5ML PO SOLN: 5.0000 mg | Freq: Once | ORAL | Status: DC | PRN

## 2018-10-14 MED ORDER — HYDROMORPHONE HCL 1 MG/ML IJ SOLN
INTRAMUSCULAR | Status: AC
  Administered 2018-10-14: 0.5 mg via INTRAVENOUS
  Filled 2018-10-14: qty 1

## 2018-10-14 MED ORDER — IBUPROFEN 600 MG PO TABS
600.0000 mg | ORAL_TABLET | Freq: Four times a day (QID) | ORAL | Status: DC
  Administered 2018-10-15 – 2018-10-18 (×13): 600 mg via ORAL
  Filled 2018-10-14 (×13): qty 1

## 2018-10-14 MED ORDER — LACTATED RINGERS IV SOLN
INTRAVENOUS | Status: DC
  Administered 2018-10-14 – 2018-10-15 (×2): via INTRAVENOUS

## 2018-10-14 MED ORDER — VASOPRESSIN 20 UNIT/ML IV SOLN
INTRAVENOUS | Status: DC | PRN
  Administered 2018-10-14: 27 mL via INTRAMUSCULAR
  Administered 2018-10-14: 3 mL via INTRAMUSCULAR

## 2018-10-14 MED ORDER — DEXAMETHASONE SODIUM PHOSPHATE 4 MG/ML IJ SOLN
INTRAMUSCULAR | Status: DC | PRN
  Administered 2018-10-14: 4 mg via INTRAVENOUS

## 2018-10-14 MED ORDER — HYDROMORPHONE HCL 1 MG/ML IJ SOLN
INTRAMUSCULAR | Status: DC | PRN
  Administered 2018-10-14 (×2): 0.5 mg via INTRAVENOUS

## 2018-10-14 MED ORDER — ROCURONIUM BROMIDE 100 MG/10ML IV SOLN
INTRAVENOUS | Status: AC
  Filled 2018-10-14: qty 1

## 2018-10-14 MED ORDER — LACTATED RINGERS IV SOLN
INTRAVENOUS | Status: DC | PRN
  Administered 2018-10-14 (×3): via INTRAVENOUS

## 2018-10-14 MED ORDER — NALOXONE HCL 0.4 MG/ML IJ SOLN: 0.4000 mg | INTRAMUSCULAR | Status: DC | PRN

## 2018-10-14 MED ORDER — MENTHOL 3 MG MT LOZG: 1.0000 | LOZENGE | OROMUCOSAL | Status: DC | PRN

## 2018-10-14 SURGICAL SUPPLY — 54 items
ADAPTER CATH SYR TO TUBING 38M (ADAPTER) IMPLANT
ADPR CATH LL SYR 3/32 TPR (ADAPTER)
BARRIER ADHS 3X4 INTERCEED (GAUZE/BANDAGES/DRESSINGS) IMPLANT
BRR ADH 4X3 ABS CNTRL BYND (GAUZE/BANDAGES/DRESSINGS)
CANISTER SUCT 3000ML PPV (MISCELLANEOUS) ×3 IMPLANT
CONT PATH 16OZ SNAP LID 3702 (MISCELLANEOUS) ×3 IMPLANT
DECANTER SPIKE VIAL GLASS SM (MISCELLANEOUS) ×3 IMPLANT
DRAIN JACKSON PRT FLT 7MM (DRAIN) IMPLANT
DRAPE CESAREAN BIRTH W POUCH (DRAPES) ×3 IMPLANT
DRSG OPSITE POSTOP 4X10 (GAUZE/BANDAGES/DRESSINGS) ×2 IMPLANT
ELECT NDL TIP 2.8 STRL (NEEDLE) IMPLANT
ELECT NEEDLE TIP 2.8 STRL (NEEDLE) ×3 IMPLANT
EVACUATOR SILICONE 100CC (DRAIN) IMPLANT
GAUZE 4X4 16PLY RFD (DISPOSABLE) ×3 IMPLANT
GLOVE BIO SURGEON STRL SZ 6.5 (GLOVE) ×2 IMPLANT
GLOVE BIO SURGEONS STRL SZ 6.5 (GLOVE) ×1
GLOVE BIOGEL PI IND STRL 6.5 (GLOVE) ×1 IMPLANT
GLOVE BIOGEL PI IND STRL 7.0 (GLOVE) ×2 IMPLANT
GLOVE BIOGEL PI INDICATOR 6.5 (GLOVE) ×2
GLOVE BIOGEL PI INDICATOR 7.0 (GLOVE) ×4
GLOVE ECLIPSE 6.5 STRL STRAW (GLOVE) ×2 IMPLANT
GOWN STRL REUS W/TWL LRG LVL3 (GOWN DISPOSABLE) ×6 IMPLANT
HEMOSTAT SURGICEL 2X14 (HEMOSTASIS) IMPLANT
IV STOPCOCK 4 WAY 40  W/Y SET (IV SOLUTION)
IV STOPCOCK 4 WAY 40 W/Y SET (IV SOLUTION) IMPLANT
NEEDLE HYPO 22GX1.5 SAFETY (NEEDLE) ×6 IMPLANT
NS IRRIG 1000ML POUR BTL (IV SOLUTION) ×7 IMPLANT
PACK ABDOMINAL GYN (CUSTOM PROCEDURE TRAY) ×3 IMPLANT
PAD OB MATERNITY 4.3X12.25 (PERSONAL CARE ITEMS) ×3 IMPLANT
PENCIL SMOKE EVAC W/HOLSTER (ELECTROSURGICAL) ×3 IMPLANT
PROTECTOR NERVE ULNAR (MISCELLANEOUS) ×3 IMPLANT
SPONGE LAP 18X18 RF (DISPOSABLE) ×10 IMPLANT
SPONGE SURGIFOAM ABS GEL 12-7 (HEMOSTASIS) ×2 IMPLANT
STAPLER VISISTAT 35W (STAPLE) ×3 IMPLANT
SUT CHROMIC 0 CT 1 (SUTURE) ×3 IMPLANT
SUT CHROMIC 3 0 SH 27 (SUTURE) IMPLANT
SUT MNCRL AB 3-0 PS2 27 (SUTURE) IMPLANT
SUT PDS AB 0 CT 36 (SUTURE) IMPLANT
SUT PLAIN 2 0 (SUTURE) ×6
SUT PLAIN 2 0 XLH (SUTURE) IMPLANT
SUT PLAIN ABS 2-0 CT1 27XMFL (SUTURE) IMPLANT
SUT VIC AB 0 CT1 18XCR BRD8 (SUTURE) ×3 IMPLANT
SUT VIC AB 0 CT1 36 (SUTURE) ×8 IMPLANT
SUT VIC AB 0 CT1 8-18 (SUTURE) ×9
SUT VIC AB 2-0 SH 27 (SUTURE) ×21
SUT VIC AB 2-0 SH 27XBRD (SUTURE) ×7 IMPLANT
SUT VICRYL 0 TIES 12 18 (SUTURE) ×2 IMPLANT
SUT VICRYL 0 UR6 27IN ABS (SUTURE) IMPLANT
SUT VICRYL 2 0 18  UND BR (SUTURE)
SUT VICRYL 2 0 18 UND BR (SUTURE) IMPLANT
SYR 50ML LL SCALE MARK (SYRINGE) IMPLANT
SYR CONTROL 10ML LL (SYRINGE) ×6 IMPLANT
TOWEL OR 17X24 6PK STRL BLUE (TOWEL DISPOSABLE) ×6 IMPLANT
TRAY FOLEY W/BAG SLVR 14FR (SET/KITS/TRAYS/PACK) ×3 IMPLANT

## 2018-10-14 NOTE — Anesthesia Procedure Notes (Signed)
Procedure Name: Intubation Date/Time: 10/14/2018 1:07 PM Performed by: Sandrea Matte, CRNA Pre-anesthesia Checklist: Patient identified, Emergency Drugs available, Suction available and Patient being monitored Patient Re-evaluated:Patient Re-evaluated prior to induction Oxygen Delivery Method: Circle system utilized Preoxygenation: Pre-oxygenation with 100% oxygen Induction Type: IV induction Ventilation: Mask ventilation without difficulty Laryngoscope Size: Mac and 3 Grade View: Grade I Tube type: Oral Tube size: 7.0 mm Number of attempts: 1 Airway Equipment and Method: Stylet Placement Confirmation: ETT inserted through vocal cords under direct vision,  positive ETCO2 and breath sounds checked- equal and bilateral Secured at: 22 (# teeth) cm Tube secured with: Tape Dental Injury: Teeth and Oropharynx as per pre-operative assessment

## 2018-10-14 NOTE — Anesthesia Preprocedure Evaluation (Signed)
Anesthesia Evaluation  Patient identified by MRN, date of birth, ID band Patient awake    Reviewed: Allergy & Precautions, H&P , NPO status , Patient's Chart, lab work & pertinent test results, reviewed documented beta blocker date and time   Airway Mallampati: II  TM Distance: >3 FB Neck ROM: full    Dental no notable dental hx.    Pulmonary neg pulmonary ROS,    Pulmonary exam normal breath sounds clear to auscultation       Cardiovascular Exercise Tolerance: Good negative cardio ROS   Rhythm:regular Rate:Normal     Neuro/Psych negative neurological ROS  negative psych ROS   GI/Hepatic negative GI ROS, Neg liver ROS,   Endo/Other  Morbid obesity  Renal/GU negative Renal ROS  negative genitourinary   Musculoskeletal   Abdominal   Peds  Hematology negative hematology ROS (+)   Anesthesia Other Findings   Reproductive/Obstetrics negative OB ROS                             Anesthesia Physical Anesthesia Plan  ASA: II  Anesthesia Plan: General   Post-op Pain Management:    Induction: Intravenous  PONV Risk Score and Plan: 3 and Ondansetron, Dexamethasone, Treatment may vary due to age or medical condition and Scopolamine patch - Pre-op  Airway Management Planned: Oral ETT and LMA  Additional Equipment:   Intra-op Plan:   Post-operative Plan: Extubation in OR  Informed Consent: I have reviewed the patients History and Physical, chart, labs and discussed the procedure including the risks, benefits and alternatives for the proposed anesthesia with the patient or authorized representative who has indicated his/her understanding and acceptance.   Dental Advisory Given  Plan Discussed with: CRNA, Anesthesiologist and Surgeon  Anesthesia Plan Comments: (  )        Anesthesia Quick Evaluation

## 2018-10-14 NOTE — Transfer of Care (Signed)
Immediate Anesthesia Transfer of Care Note  Patient: Ruth Graham  Procedure(s) Performed: MYOMECTOMY with Vertical Incision (N/A Abdomen)  Patient Location: PACU  Anesthesia Type:General  Level of Consciousness: awake, alert  and patient cooperative  Airway & Oxygen Therapy: Patient Spontanous Breathing and Patient connected to nasal cannula oxygen  Post-op Assessment: Report given to RN and Post -op Vital signs reviewed and stable  Post vital signs: Reviewed and stable  Last Vitals:  Vitals Value Taken Time  BP    Temp    Pulse    Resp    SpO2      Last Pain:  Vitals:   10/14/18 1125  TempSrc: Oral  PainSc: 0-No pain      Patients Stated Pain Goal: 3 (68/54/88 3014)  Complications: No apparent anesthesia complications

## 2018-10-14 NOTE — Anesthesia Postprocedure Evaluation (Signed)
Anesthesia Post Note  Patient: Aiyanah Bernstein  Procedure(s) Performed: MYOMECTOMY with Vertical Incision (N/A Abdomen)     Patient location during evaluation: PACU Anesthesia Type: General Level of consciousness: awake and alert Pain management: pain level controlled Vital Signs Assessment: post-procedure vital signs reviewed and stable Respiratory status: spontaneous breathing, nonlabored ventilation, respiratory function stable and patient connected to nasal cannula oxygen Cardiovascular status: blood pressure returned to baseline and stable Postop Assessment: no apparent nausea or vomiting Anesthetic complications: no    Last Vitals:  Vitals:   10/14/18 1600 10/14/18 1615  BP: 128/74 131/75  Pulse: 69 67  Resp: 18 16  Temp:    SpO2: 100% 100%    Last Pain:  Vitals:   10/14/18 1615  TempSrc:   PainSc: 4    Pain Goal: Patients Stated Pain Goal: 3 (10/14/18 1125)               Jean Skow

## 2018-10-14 NOTE — Op Note (Signed)
Preop diagnosis: Symptomatic fibroids  Postdiagnosis:  Same Procedure: Abdominal myomectomy Surgeon: Dr. Charlesetta Garibaldi Assistant: Earnstine Regal Anesthesia: General endotracheal tube Estimated blood loss is 850cc Urine output: 100 cc IV fluids: 2000 cc Indications: None Procedure in detail: She was taken to the operating room placed in dorsal supine position and prepped and draped in the normal sterile fashion. A vertical   skin incision was made with a knife and carried down to the fascia using Bovie cautery. The fascia was incised in midline extended superiorly and inferiorly. Cokers x2 placed on  the  the fascia and  was dissected off the rectus muscles both sharply and bluntly.  There was some bleeding on the left side of the muscle.  This was made hemostatic with figuer of eight 2-0 vicryl stitch.    Peritoneum was identified And entered sharply with Metzenbaum scissors. The incision was extended superiorly and inferiorly with good visualization of bowel bladder. I  was unable to exteriorize the uterus because of its size.even with using the cork screw.    The skin incision was increased a little above the belly button.  . Both tubes appeared to be normal. The ovaries had normal appearance bilaterally.   .  A 4 cm long incision was made with the bovie after petrussin mixture was placed under the serosa.   The fibroid was then bluntly and sharply dissected out of the cavity was about 20 cm in size. The endometrial canal was  not entered. The uterine incision was closed in layers using 0 and 2-0 Vicryl with figure-of-eight interrupted suture. The serosa was closed using a 2-0 Vicryl in a baseball stitch fashion.    Petrussin mixture was placed posteriorly just under the areaof the last incision.  A 5-6 cm fibroid was removed and . Areas were made hemostatic with 2-0 Vicryl figure-of-eight stitch.  There was a 3 cm anterior fibroid moved from the left side of the uterus as well.  Two small fibroids less than 1  cm in size was removed from both sides of the cervix anteriorly.   Irrigation was done. Hemostasis was assured.  The uterus was replaced back into the pelvic cavity. The peritoneum was closed to2- 0 chromic.  Muscles were irrigated and hemostatic with Bovie cautery and any bleeding areas. The fascia was closed using PDS l in a running fashion from both superiorly and inferiorly on the incision.  .  . The subcutaneous tissue was irrigated and made hemostatic with Bovie cautery. 2 -0 plain interrupted sutures were used to reapproximate the subcutaneous tissue. The skin was closed with staples. Instrument and sponge counts were correct patient went to recovery in stable condition this is Dr. Charlesetta Garibaldi dictating I was present for the entire procedure thank you

## 2018-10-15 ENCOUNTER — Encounter (HOSPITAL_COMMUNITY): Payer: Self-pay | Admitting: Obstetrics and Gynecology

## 2018-10-15 LAB — CBC
HCT: 27.6 % — ABNORMAL LOW (ref 36.0–46.0)
HEMOGLOBIN: 9.2 g/dL — AB (ref 12.0–15.0)
MCH: 28.8 pg (ref 26.0–34.0)
MCHC: 33.3 g/dL (ref 30.0–36.0)
MCV: 86.3 fL (ref 80.0–100.0)
PLATELETS: 275 10*3/uL (ref 150–400)
RBC: 3.2 MIL/uL — ABNORMAL LOW (ref 3.87–5.11)
RDW: 12.8 % (ref 11.5–15.5)
WBC: 12.2 10*3/uL — ABNORMAL HIGH (ref 4.0–10.5)
nRBC: 0 % (ref 0.0–0.2)

## 2018-10-15 NOTE — Progress Notes (Signed)
Ruth Graham is a27 y.o.  [id number]  Post Op Date # 1:  Abdominal Myomectomy  Subjective: Patient is Doing well postoperatively. Patient has Pain is controlled with current analgesics. Medications being used: narcotic analgesics including PCA Hydromorphone.  Has ambulated in the halls without difficulty, tolerating liquids, hasn't voided yet and has not passed flatus.   Objective: Vital signs in last 24 hours: Temp:  [98 F (36.7 C)-98.9 F (37.2 C)] 98.7 F (37.1 C) (11/20 0409) Pulse Rate:  [64-89] 79 (11/20 0409) Resp:  [14-20] 20 (11/20 0600) BP: (111-144)/(62-92) 144/72 (11/20 0409) SpO2:  [98 %-100 %] 98 % (11/20 0600) Weight:  [85.3 kg] 85.3 kg (11/19 1125)  Intake/Output from previous day: 11/19 0701 - 11/20 0700 In: 2920 [P.O.:120; I.V.:2800] Out: 1425 [Urine:675] Intake/Output this shift: Total I/O In: 120 [P.O.:120] Out: 450 [Urine:450] Recent Labs  Lab 10/15/18 0515  WBC 12.2*  HGB 9.2*  HCT 27.6*  PLT 275    No results for input(s): NA, K, CL, CO2, BUN, CREATININE, CALCIUM, PROT, BILITOT, ALKPHOS, ALT, AST, GLUCOSE in the last 168 hours.  Invalid input(s): LABALBU  EXAM: General: alert, cooperative and no distress Resp: clear to auscultation bilaterally Cardio: regular rate and rhythm, S1, S2 normal, no murmur, click, rub or gallop GI: Soft bowel sounds;  dressing is intact and dry with dried stain on lower quarter of incisional dressing. Extremities: Homans sign is negative, no sign of DVT and no calf tenderness; SCD hose in place and functioning.   Assessment: s/p Procedure(s): MYOMECTOMY with Vertical Incision: stable, progressing well and anemia  Plan: Advance diet Encourage ambulation Advance to PO medication Routine care  LOS: 1 day    Earnstine Regal, PA-C 10/15/2018 6:48 AM

## 2018-10-15 NOTE — Anesthesia Postprocedure Evaluation (Signed)
Anesthesia Post Note  Patient: Ruth Graham  Procedure(s) Performed: MYOMECTOMY with Vertical Incision (N/A Abdomen)     Patient location during evaluation: [facility] Anesthesia Type: General Level of consciousness: awake Pain management: pain level controlled Vital Signs Assessment: post-procedure vital signs reviewed and stable Respiratory status: spontaneous breathing Cardiovascular status: stable Postop Assessment: no apparent nausea or vomiting, adequate PO intake and able to ambulate Anesthetic complications: no    Last Vitals:  Vitals:   10/15/18 0748 10/15/18 1148  BP: (!) 98/44 (!) 100/53  Pulse: 93 (!) 106  Resp: 18 18  Temp: 36.7 C 37.4 C  SpO2: 100% 95%    Last Pain:  Vitals:   10/15/18 1225  TempSrc:   PainSc: 2    Pain Goal: Patients Stated Pain Goal: 3 (10/15/18 0600)               Cadan Maggart

## 2018-10-15 NOTE — Discharge Instructions (Signed)
Call [facility] @ [phone] if:  You have a temperature greater than or equal to 100.4 degrees Farenheit orally You have pain that is not made better by the pain medication given and taken as directed You have excessive bleeding or problems urinating  Take Colace (Docusate Sodium/Stool Softener) 100 mg 2-3 times daily while taking narcotic pain medicine to avoid constipation or until bowel movements are regular. Take Ibuprofen 600 mg with food ,  every 6 hours for 5 days then as needed for pain Take an over the counter iron supplement of your choice,  twice a day for the next 6 months  You may drive after 2 weeks You may walk up steps  You may shower  You may resume a regular diet  Keep incisions clean and dry;  remove honeycomb dressing on 10/21/2018 Do not lift over 15 pounds for 6 weeks Avoid anything in vagina for 6 weeks (or until after your post-operative visit)

## 2018-10-15 NOTE — Addendum Note (Signed)
Addendum  created 10/15/18 1330 by Ignacia Bayley, CRNA   Sign clinical note

## 2018-10-16 LAB — TYPE AND SCREEN
ABO/RH(D): O POS
Antibody Screen: NEGATIVE
UNIT DIVISION: 0
Unit division: 0

## 2018-10-16 LAB — BPAM RBC
BLOOD PRODUCT EXPIRATION DATE: 201912202359
BLOOD PRODUCT EXPIRATION DATE: 201912202359
UNIT TYPE AND RH: 5100
Unit Type and Rh: 5100

## 2018-10-16 MED ORDER — BISACODYL 10 MG RE SUPP
10.0000 mg | Freq: Once | RECTAL | Status: AC
  Administered 2018-10-16: 10 mg via RECTAL
  Filled 2018-10-16: qty 1

## 2018-10-16 NOTE — Progress Notes (Signed)
   10/16/18 2137  Gastrointestinal  Gastrointestinal (WDL) X  Abdomen Inspection Soft;Distended  Bowel Sounds Assessment Hypoactive (upper quardants, none lower quards)  Tenderness Tender  Passing Flatus No  GI Symptoms None  Gastrointestinal Additional Assessments None

## 2018-10-16 NOTE — Progress Notes (Signed)
Ruth Graham  [id number]  Post Op Date # 2: Abdominal Myomectomy  Subjective: Patient is Doing well postoperatively. Patient has Pain is controlled with current analgesics. Medications being used: prescription NSAID's including Ibuprofen 600 mg and narcotic analgesics including Percocet 5/325. Ambulating in the halls, voiding without difficulty, tolerating a regular diet without nausea or vomiting.  Objective: Vital signs in last 24 hours: Temp:  [98.3 F (36.8 C)-100.9 F (38.3 C)] 98.7 F (37.1 C) (11/21 0740) Pulse Rate:  [92-113] 92 (11/21 0740) Resp:  [16-18] 18 (11/21 0740) BP: (100-121)/(53-72) 117/65 (11/21 0740) SpO2:  [95 %-99 %] 99 % (11/21 0740)  Intake/Output from previous day: 11/20 0701 - 11/21 0700 In: -  Out: 700 [Urine:700] Intake/Output this shift: No intake/output data recorded. Recent Labs  Lab 10/15/18 0515  WBC 12.2*  HGB 9.2*  HCT 27.6*  PLT 275    No results for input(s): NA, K, CL, CO2, BUN, CREATININE, CALCIUM, PROT, BILITOT, ALKPHOS, ALT, AST, GLUCOSE in the last 168 hours.  Invalid input(s): LABALBU  EXAM: General: alert, cooperative and no distress Resp: clear to auscultation bilaterally Cardio: regular rate and rhythm, S1, S2 normal, no murmur, click, rub or gallop GI: Bowel sounds present;  dressing is dry and intact with no additonal stain on bandage. Lower quarter stain from yesterday remains dry. Extremities: Homans sign is negative, no sign of DVT and no calf tenderness.   Assessment: s/p Procedure(s): MYOMECTOMY with Vertical Incision: stable, progressing well, tolerating diet and anemia  Plan: Routine care  Consider discharge home.  LOS: 2 days    Earnstine Regal, PA-C 10/16/2018 7:49 AM

## 2018-10-17 MED ORDER — METOCLOPRAMIDE HCL 10 MG PO TABS
10.0000 mg | ORAL_TABLET | Freq: Four times a day (QID) | ORAL | Status: DC
  Administered 2018-10-17 – 2018-10-18 (×5): 10 mg via ORAL
  Filled 2018-10-17 (×5): qty 1

## 2018-10-17 MED ORDER — METOCLOPRAMIDE HCL 10 MG PO TABS: 10.0000 mg | ORAL_TABLET | Freq: Four times a day (QID) | ORAL | 0 refills | Status: DC

## 2018-10-17 MED ORDER — METOCLOPRAMIDE HCL 5 MG/5ML PO SOLN
10.0000 mg | Freq: Four times a day (QID) | ORAL | Status: DC
  Filled 2018-10-17 (×2): qty 10

## 2018-10-17 NOTE — Plan of Care (Signed)
completed

## 2018-10-17 NOTE — Progress Notes (Signed)
Ruth Graham is a27 y.o.  [id number]  Post Op Date #3: Abdominal Myomectomy  Subjective: Patient is Postoperative course complicated by abdominal distention, inability to pass flatus and nausea/vomiting. Patient has Pain is controlled with current analgesics. Medications being used: prescription NSAID's including Ibuprofen 600 mg and narcotic analgesics including Percocet 5/325. Ambulating in the halls and voiding  without difficulty.  Consumed a regular diet earlier yesterday but vomited early morning (per RN) billious material.   Objective: Vital signs in last 24 hours: Temp:  [98.3 F (36.8 C)-100 F (37.8 C)] 99.3 F (37.4 C) (11/22 0247) Pulse Rate:  [97-134] 123 (11/22 0247) Resp:  [18-20] 18 (11/22 0247) BP: (117-132)/(66-85) 129/78 (11/22 0247) SpO2:  [98 %-100 %] 98 % (11/22 0247)  Intake/Output from previous day: No intake/output data recorded. Intake/Output this shift: No intake/output data recorded. Recent Labs  Lab 10/15/18 0515  WBC 12.2*  HGB 9.2*  HCT 27.6*  PLT 275    No results for input(s): NA, K, CL, CO2, BUN, CREATININE, CALCIUM, PROT, BILITOT, ALKPHOS, ALT, AST, GLUCOSE in the last 168 hours.  Invalid input(s): LABALBU  EXAM: General: alert, cooperative and mild distress Resp: clear to auscultation bilaterally Cardio: regular rate and rhythm, S1, S2 normal, no murmur, click, rub or gallop GI: Abdomen is distended with decreased bowel sounds;  dresssing is intact and dry with no change in dried stain at lower portion of dressing. Extremities: Homans sign is negative, no sign of DVT and no calf tenderness.   Assessment: s/p Procedure(s): MYOMECTOMY with Vertical Incision: stable, ileus present and anemia  Plan: Will consider Reglan Consult Dr. Charlesetta Garibaldi.  Routine care  LOS: 3 days    Ruth Regal, PA-C 10/17/2018 7:48 AM

## 2018-10-17 NOTE — Progress Notes (Addendum)
   10/17/18 0618  Gastrointestinal  Gastrointestinal (WDL) X  Abdomen Inspection Soft;Distended  Bowel Sounds Assessment Faint (LLq)  Tenderness Tender  Passing Flatus No  GI Symptoms None  Gastrointestinal Additional Assessments None    Patient vomited x 1 approximately 300cc yellow-green liquid at 0200.  Zofran 4mg  po given.  Patient was able to keep down Zofran.  Patient has no IV access.  Encourage patient to stay on clear liquids or ice chips until MD comes in.  Dulcolax suppository given at 2020 with no results as stated by patient.  Patient has walked a total of 8 times during the night.

## 2018-10-18 MED ORDER — DOCUSATE SODIUM 100 MG PO CAPS: 100.0000 mg | ORAL_CAPSULE | Freq: Two times a day (BID) | ORAL | 0 refills | Status: AC

## 2018-10-18 MED ORDER — OXYCODONE-ACETAMINOPHEN 5-325 MG PO TABS: 1.0000 | ORAL_TABLET | Freq: Four times a day (QID) | ORAL | 0 refills | Status: AC | PRN

## 2018-10-18 MED ORDER — METOCLOPRAMIDE HCL 10 MG PO TABS: 10.0000 mg | ORAL_TABLET | Freq: Four times a day (QID) | ORAL | 0 refills | Status: AC

## 2018-10-18 MED ORDER — IBUPROFEN 600 MG PO TABS: 600.0000 mg | ORAL_TABLET | Freq: Four times a day (QID) | ORAL | 0 refills | Status: AC

## 2018-10-18 NOTE — Progress Notes (Signed)
Ruth Graham is a27 y.o.  [id number]  Post Op Date #4: Abdominal Myomectomy  Subjective: She is doing better this am.  Denies nausea/vomiting for the past 24hr.  Tolerating some general diet, mostly clears.  No F/C/CP/SOB.  +flatus, +BM yesterday.  Pain controlled with current medication   Objective: Vital signs in last 24 hours: Temp:  [98.8 F (37.1 C)-99.2 F (37.3 C)] 99 F (37.2 C) (11/23 0756) Pulse Rate:  [88-106] 90 (11/23 0756) Resp:  [16-18] 17 (11/23 0756) BP: (102-121)/(60-77) 121/70 (11/23 0756) SpO2:  [93 %-100 %] 95 % (11/23 0756)  Intake/Output from previous day: No intake/output data recorded. Intake/Output this shift: No intake/output data recorded. Recent Labs  Lab 10/15/18 0515  WBC 12.2*  HGB 9.2*  HCT 27.6*  PLT 275    No results for input(s): NA, K, CL, CO2, BUN, CREATININE, CALCIUM, PROT, BILITOT, ALKPHOS, ALT, AST, GLUCOSE in the last 168 hours.  Invalid input(s): LABALBU  EXAM: Gen: NAD CV: RRR Lungs: CTAB Abd: mild distension noted, soft, no rebound, no guarding, +BS Midline vertical incision- C/D/I with honeycomb Ext: no edema, no calf tenderness bilaterally  Assessment: s/p Procedure(s): MYOMECTOMY with Vertical Incision: stable, ileus present and anemia  Plan: - regular diet this am, if well tolerated, plan for discharge home later today -continue oral pain medication -voiding freely -meeting postop milestones appropriately  LOS: 4 days    Janyth Pupa, DO [phone] (cell) [phone] (office)

## 2018-10-19 ENCOUNTER — Inpatient Hospital Stay (HOSPITAL_COMMUNITY)
Admission: AD | Admit: 2018-10-19 | Discharge: 2018-10-19 | Disposition: A | Discharge status: Home / Self Care | Payer: BLUE CROSS/BLUE SHIELD | Source: Ambulatory Visit | Attending: Obstetrics and Gynecology | Admitting: Obstetrics and Gynecology

## 2018-10-19 DIAGNOSIS — Z9889 Other specified postprocedural states: Secondary | ICD-10-CM | POA: Diagnosis not present

## 2018-10-19 DIAGNOSIS — Z711 Person with feared health complaint in whom no diagnosis is made: Secondary | ICD-10-CM | POA: Diagnosis not present

## 2018-10-19 NOTE — Discharge Instructions (Signed)
Myomectomy, Care After °Refer to this sheet in the next few weeks. These instructions provide you with information on caring for yourself after your procedure. Your health care provider may also give you more specific instructions. Your treatment has been planned according to current medical practices, but problems sometimes occur. Call your health care provider if you have any problems or questions after your procedure. °What can I expect after the procedure? °After your procedure, it is typical to have the following: °· Pain in your abdomen, especially at any incision sites. You will be given pain medicine to control the pain. °· Tiredness. This is a normal part of the recovery process. Your energy level will return to normal over the next several weeks. °· Constipation. °· Vaginal bleeding. This is normal and should stop after 1-2 weeks. ° °Follow these instructions at home: °· Only take over-the-counter or prescription medicines as directed by your health care provider. Avoid aspirin because it can cause bleeding. °· Do not douche, use tampons, or have sexual intercourse until given permission by your health care provider. °· Remove or change any bandages (dressings) as directed by your health care provider. °· Take showers instead of baths as directed by your health care provider. °· You will probably be able to go back to your normal routine after a few days. Do not do anything that requires extra effort until your health care provider says it is okay. Do not lift anything heavier than 15 pounds (6.8 kg) until your health care provider approves. °· Walk daily but take frequent rest breaks if you tire easily. °· Continue to practice deep breathing and coughing. If it hurts to cough, try holding a pillow against your belly as you cough. °· If you become constipated, you may: °? Use a mild laxative if your health care provider approves. °? Add more fruit and bran to your diet. °? Drink enough fluids to keep your  urine clear or pale yellow. °· Take your temperature twice a day and write it down. °· Do not drink alcohol. °· Do not drive until your health care provider approves. °· Have someone help you at home for 1 week or until you can do your own household activities. °· Follow up with your health care provider as directed. °Contact a health care provider if: °· You have a fever. °· You have increasing abdominal pain that is not relieved with medicine. °· You have nausea, vomiting, or diarrhea. °· You have pain when you urinate, or you have blood in your urine. °· You have a rash on your body. °· You have pain or redness where your IV access tube was inserted. °· You have redness, swelling, or any kind of drainage from an incision. °Get help right away if: °· You have weakness or lightheadedness. °· You have pain, swelling, or redness in your legs. °· You have chest pain. °· You faint. °· You have shortness of breath. °· You have heavy vaginal bleeding. °· Your incision is opening up. °This information is not intended to replace advice given to you by your health care provider. Make sure you discuss any questions you have with your health care provider. °Document Released: 04/04/2011 Document Revised: 04/19/2016 Document Reviewed: 06/24/2013 °Elsevier Interactive Patient Education © 2017 [employer] ° °

## 2018-10-19 NOTE — Discharge Summary (Signed)
Physician Discharge Summary  Patient ID: Ruth Graham MRN: [id number] DOB/AGE: 27/24/1992 27 y.o.  Admit date: 10/14/2018 Discharge date: 10/18/2018  Admission Diagnoses: Uterine fibroids  Discharge Diagnoses:  Active Problems:   Leiomyoma of uterus   Fibroid, uterine   Discharged Condition: stable  Hospital Course: 27yo G0 who presented for scheduled myomectomy.  For information regarding the surgery, please see the op note performed by Dr. Charlesetta Garibaldi.  Post op course was complicated by postop ileus.  By Day 3 she was passing flatus and had a BM.  She was discharged home in stable condition on POD#4  Consults: None  Significant Diagnostic Studies: labs:  CBC Latest Ref Rng & Units 10/15/2018 10/06/2018 10/01/2018  WBC 4.0 - 10.5 K/uL 12.2(H) 6.9 9.0  Hemoglobin 12.0 - 15.0 g/dL 9.2(L) 13.0 13.7  Hematocrit 36.0 - 46.0 % 27.6(L) 40.8 41.6  Platelets 150 - 400 K/uL 275 293 344     Treatments: IV hydration, antibiotics: Ancef, analgesia: Toradol and surgery: myomectomy  Discharge Exam: Blood pressure 137/78, pulse 99, temperature 99.5 F (37.5 C), temperature source Oral, resp. rate 16, height 5\' 2"  (1.575 m), weight 85.3 kg, last menstrual period 10/02/2018, SpO2 99 %.   EXAM: Gen: NAD CV: RRR         Lungs: CTAB Abd: mild distension noted, soft, no rebound, no guarding, +BS Midline vertical incision- C/D/I with honeycomb Ext: no edema, no calf tenderness bilaterally   Disposition:    Allergies as of 10/18/2018      Reactions   Other Hives   Tree nuts/walnuts/pecans      Medication List    STOP taking these medications   dicyclomine 10 MG capsule Commonly known as:  BENTYL   traMADol 50 MG tablet Commonly known as:  ULTRAM     TAKE these medications   cetirizine 10 MG tablet Commonly known as:  ZYRTEC Take 1 tablet (10 mg total) by mouth daily. What changed:    when to take this  reasons to take this   docusate sodium 100 MG capsule Commonly known  as:  COLACE Take 1 capsule (100 mg total) by mouth 2 (two) times daily.   fluticasone 50 MCG/ACT nasal spray Commonly known as:  FLONASE Place 2 sprays into both nostrils daily. What changed:    when to take this  reasons to take this   ibuprofen 600 MG tablet Commonly known as:  ADVIL,MOTRIN Take 1 tablet (600 mg total) by mouth every 6 (six) hours.   metoCLOPramide 10 MG tablet Commonly known as:  REGLAN Take 1 tablet (10 mg total) by mouth every 6 (six) hours for 5 days.   oxyCODONE-acetaminophen 5-325 MG tablet Commonly known as:  PERCOCET/ROXICET Take 1 tablet by mouth every 6 (six) hours as needed for moderate pain or severe pain.      Follow-up Information    Crawford Givens, MD Follow up on 10/28/2018.   Specialty:  Obstetrics and Gynecology Why:  STAPLE REMOVAL at 10:30 a.m.   POST OPERATIVE VISIT: 11/21/18  at   10 a.m. Contact information: [address] [place] [address] [phone]           Signed: Annalee Genta 10/19/2018, 5:06 PM

## 2018-10-19 NOTE — MAU Provider Note (Signed)
History     CSN: [id number]  Arrival date and time: 10/19/18 1918  27 y.o. female 5 days post Myomectomy here for concern that her bandage is lifting. Reports taking a shower earlier today and saw that her bandage lifted at the bottom. Denies fever and chills. No drainage or redness at incision. She is eating and drinking. Her pain is well managed with Ibuprofen and Percocet.    Past Medical History:  Diagnosis Date  . Allergy   . Angioedema 05/04/2013  . Fibroid   . HSV-2 (herpes simplex virus 2) infection     Past Surgical History:  Procedure Laterality Date  . MYOMECTOMY N/A 10/14/2018   Procedure: MYOMECTOMY with Vertical Incision;  Surgeon: Crawford Givens, MD;  Location: [facility];  Service: Gynecology;  Laterality: N/A;  . TONSILLECTOMY      Family History  Problem Relation Age of Onset  . Diabetes Mother   . Hypertension Mother   . Diabetes Father   . Hypertension Father   . Asthma Brother     Social History   Tobacco Use  . Smoking status: Never Smoker  . Smokeless tobacco: Never Used  Substance Use Topics  . Alcohol use: Yes    Comment: occasionally  . Drug use: No    Allergies:  Allergies  Allergen Reactions  . Other Hives    Tree nuts/walnuts/pecans    No medications prior to admission.    Review of Systems  Constitutional: Negative for chills and fever.  Genitourinary: Negative for difficulty urinating.   Physical Exam   Blood pressure 130/73, pulse 96, temperature 98.5 F (36.9 C), resp. rate 17, height 5\' 2"  (1.575 m), weight 84.4 kg, last menstrual period 10/02/2018, SpO2 96 %.  Physical Exam  Constitutional: She is oriented to person, place, and time. She appears well-developed and well-nourished. No distress.  HENT:  Head: Normocephalic and atraumatic.  Neck: Normal range of motion.  Respiratory: Effort normal. No respiratory distress.  GI: Soft. She exhibits no distension. There is no tenderness.  Musculoskeletal: Normal range of  motion.  Neurological: She is alert and oriented to person, place, and time.  Skin: Skin is warm and dry.  ML abd incision free of erythema or edema, mid portion stained with old blood Lower edge of tegaderm lifted  Psychiatric: She has a normal mood and affect.   MAU Course  Procedures  MDM No complication identified. Dressing reinforced with Tegaderm at bottom edge. Stable for discharge home.   Assessment and Plan   1. Post-operative state   2. Worried well    Discharge home Follow up at [facility] as scheduled Return precautions Pt to remove dressing in 4 days as previously instructed  Allergies as of 10/19/2018      Reactions   Other Hives   Tree nuts/walnuts/pecans      Medication List    TAKE these medications   cetirizine 10 MG tablet Commonly known as:  ZYRTEC Take 1 tablet (10 mg total) by mouth daily. What changed:    when to take this  reasons to take this   docusate sodium 100 MG capsule Commonly known as:  COLACE Take 1 capsule (100 mg total) by mouth 2 (two) times daily.   fluticasone 50 MCG/ACT nasal spray Commonly known as:  FLONASE Place 2 sprays into both nostrils daily. What changed:    when to take this  reasons to take this   ibuprofen 600 MG tablet Commonly known as:  ADVIL,MOTRIN Take 1 tablet (600  mg total) by mouth every 6 (six) hours.   metoCLOPramide 10 MG tablet Commonly known as:  REGLAN Take 1 tablet (10 mg total) by mouth every 6 (six) hours for 5 days.   oxyCODONE-acetaminophen 5-325 MG tablet Commonly known as:  PERCOCET/ROXICET Take 1 tablet by mouth every 6 (six) hours as needed for moderate pain or severe pain.         Julianne Handler 10/19/2018, 8:44 PM

## 2018-10-19 NOTE — MAU Note (Addendum)
Pt had a myomectomy on 11/19. Was discharged yesterday. States she showered today and states her dressing has "lifted off" her skin. States she is not suppose to remove dressing until Thursday. Wants to make sure the dressing is okay and that her incision will not get infected. Denies drainage or bleeding from incision. Reports mild incisional pain that is controlled with ibuprofen and percocet.

## 2020-07-13 ENCOUNTER — Other Ambulatory Visit: Payer: BLUE CROSS/BLUE SHIELD

## 2020-07-13 ENCOUNTER — Other Ambulatory Visit: Payer: Self-pay | Admitting: Critical Care Medicine

## 2020-07-13 DIAGNOSIS — Z20822 Contact with and (suspected) exposure to covid-19: Secondary | ICD-10-CM

## 2020-07-14 LAB — NOVEL CORONAVIRUS, NAA: SARS-CoV-2, NAA: NOT DETECTED

## 2020-07-14 LAB — SARS-COV-2, NAA 2 DAY TAT

## 2020-08-05 IMAGING — CR DG CHEST 2V
2 series · 2 of 2 positions shown · non-contrast
Comparison: None.

CLINICAL DATA: Upper respiratory infection

EXAM:
CHEST - 2 VIEW

[w chest pa]
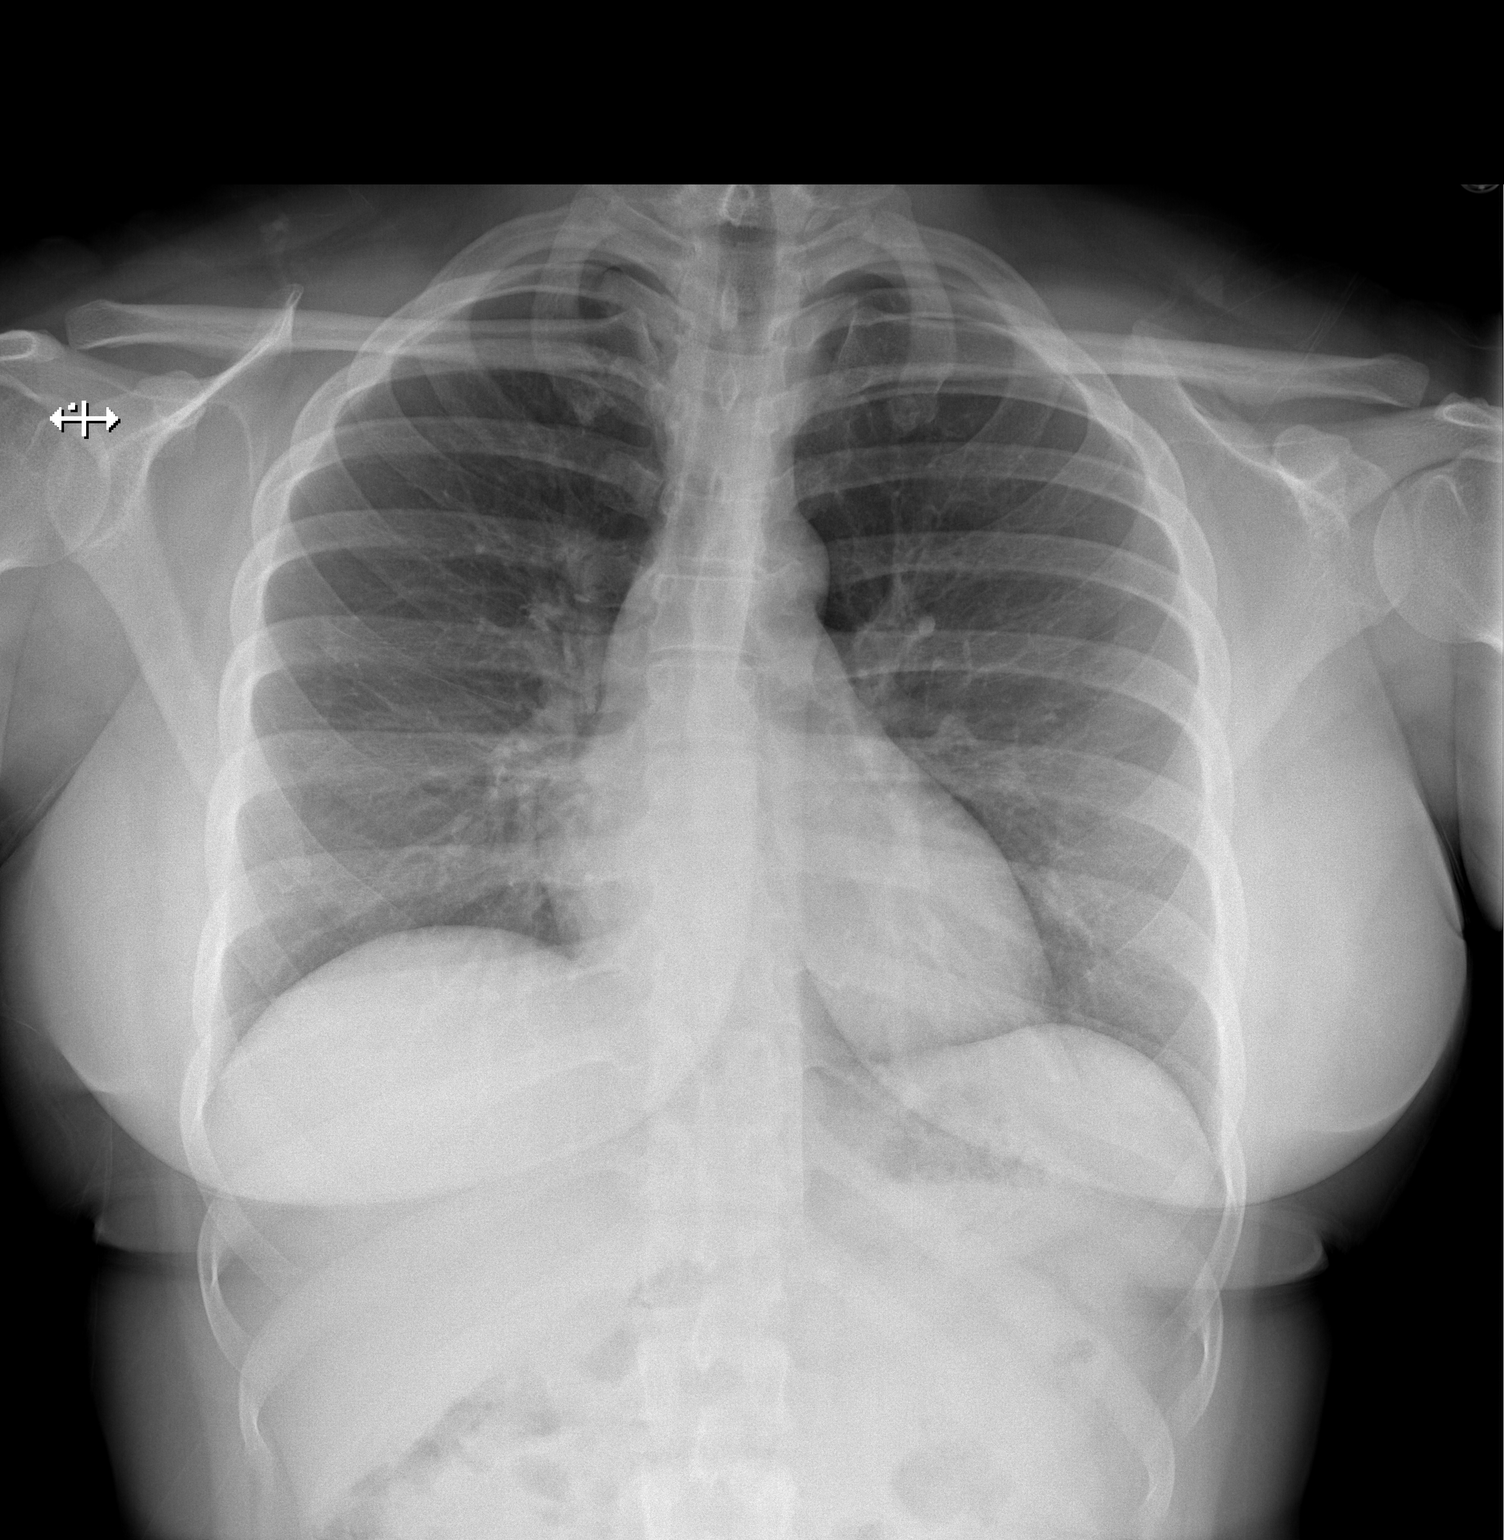

[w chest lat]
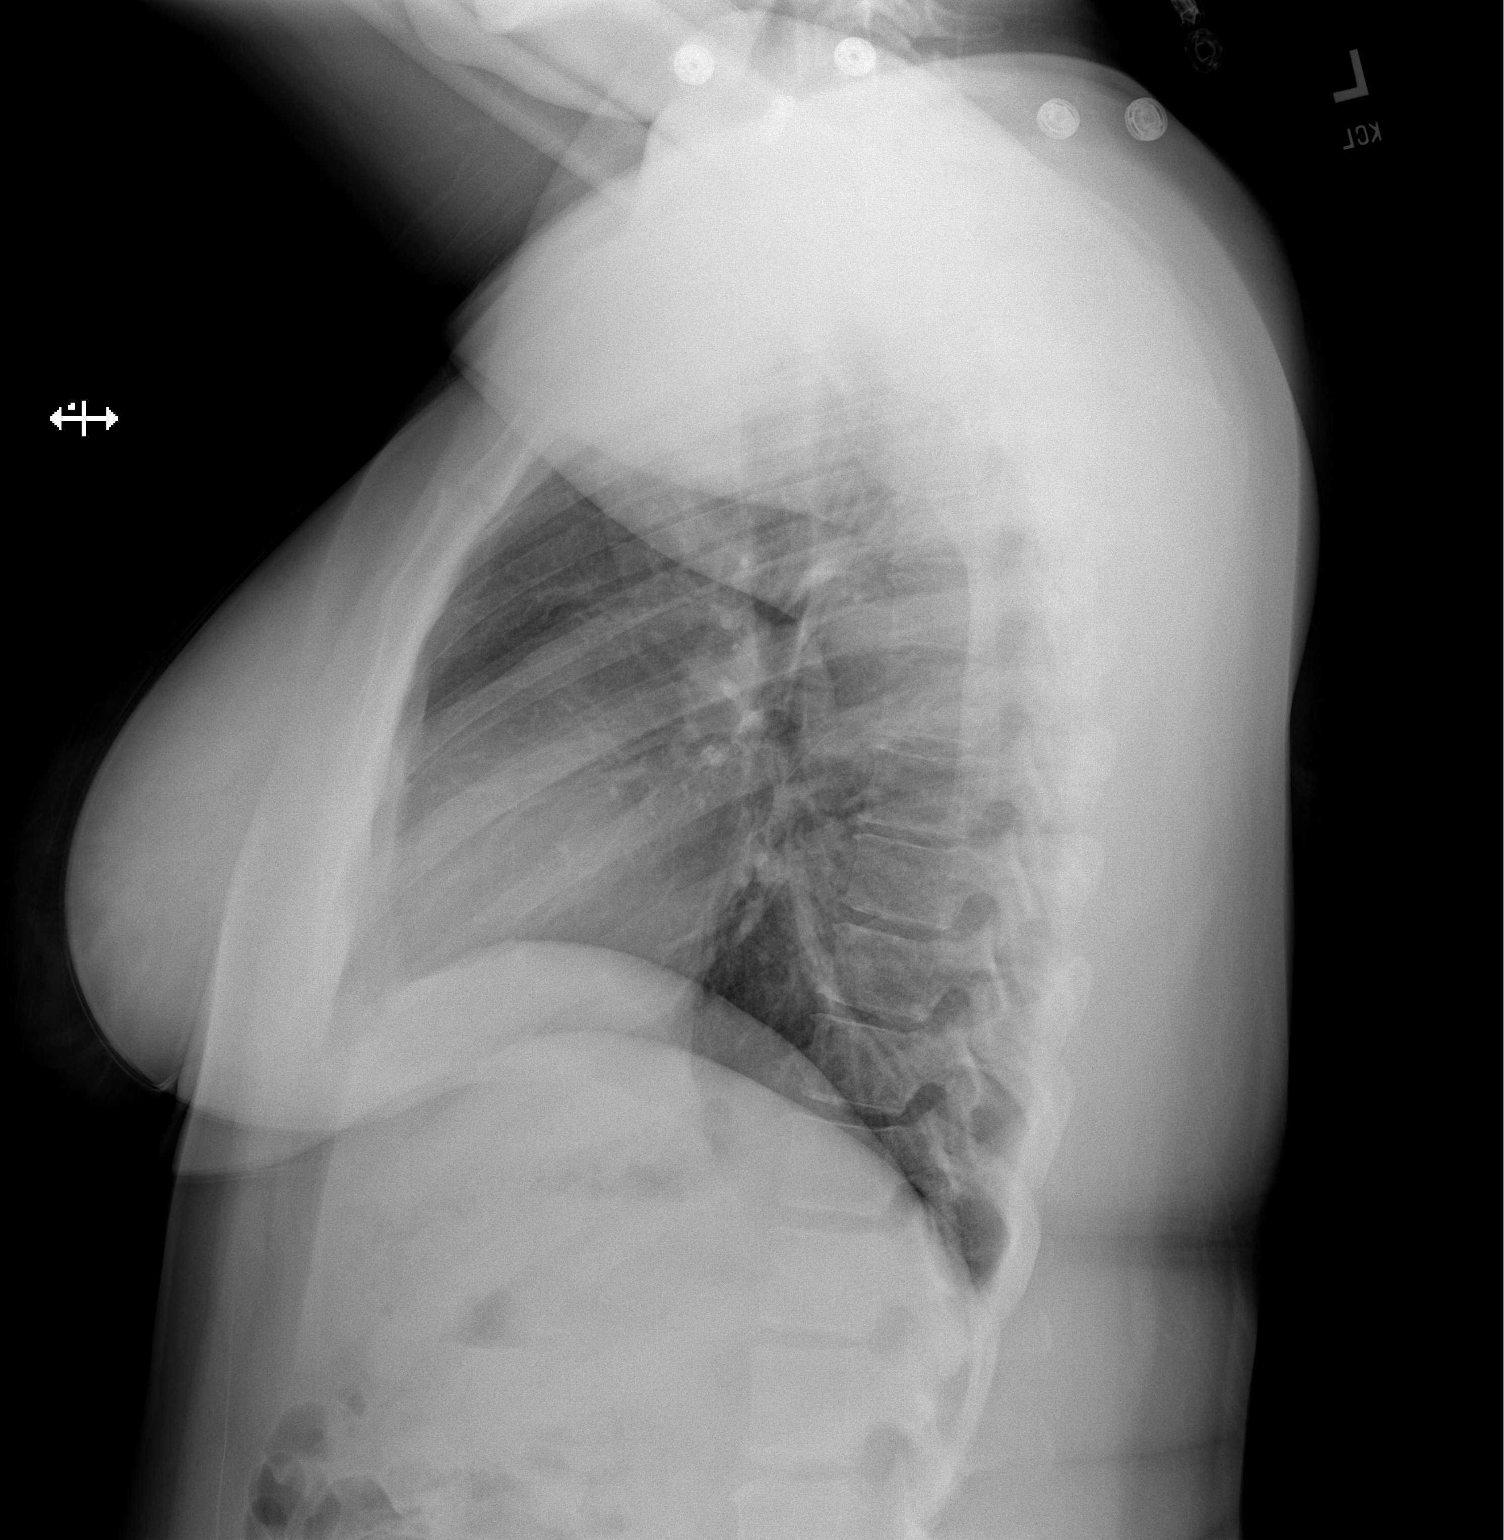

[2 of 2 positions shown; findings below may reference images not displayed]

FINDINGS: Heart and mediastinal contours are within normal limits. No focal
opacities or effusions. No acute bony abnormality.
IMPRESSION: No active cardiopulmonary disease.

## 2020-09-07 IMAGING — CR DG LUMBAR SPINE COMPLETE 4+V
5 series · 5 of 5 positions shown · non-contrast
Comparison: None.

CLINICAL DATA: Low back pain for several weeks, no known injury,
initial encounter

EXAM:
LUMBAR SPINE - COMPLETE 4+ VIEW

[t lumbar spine ap]
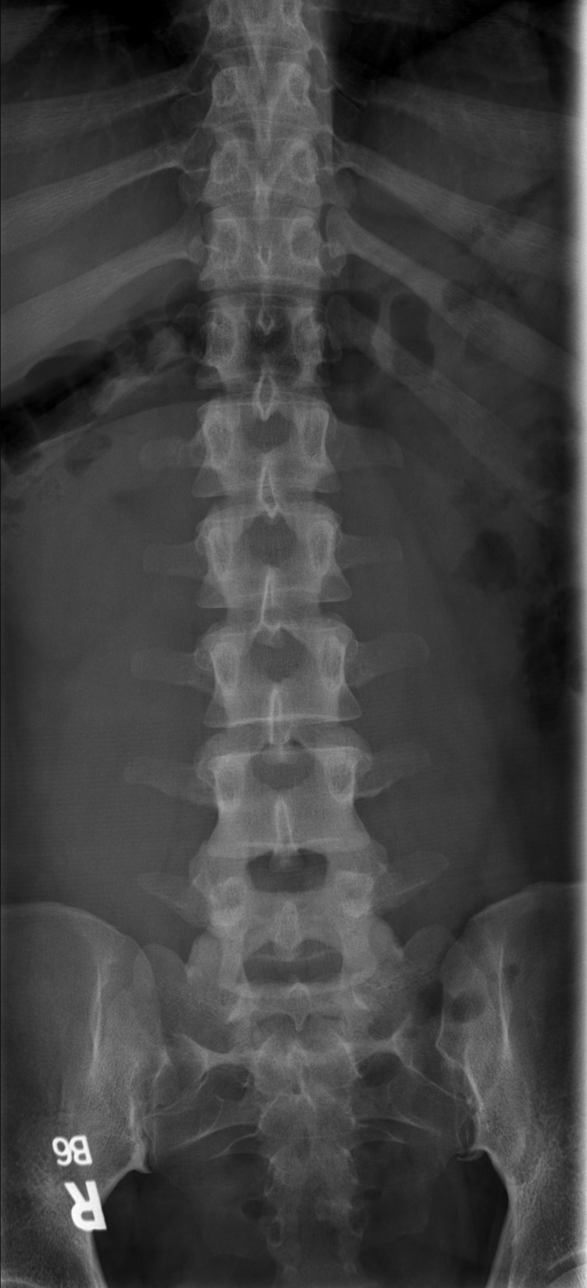

[t lumbar spine obl (1 of 2)]
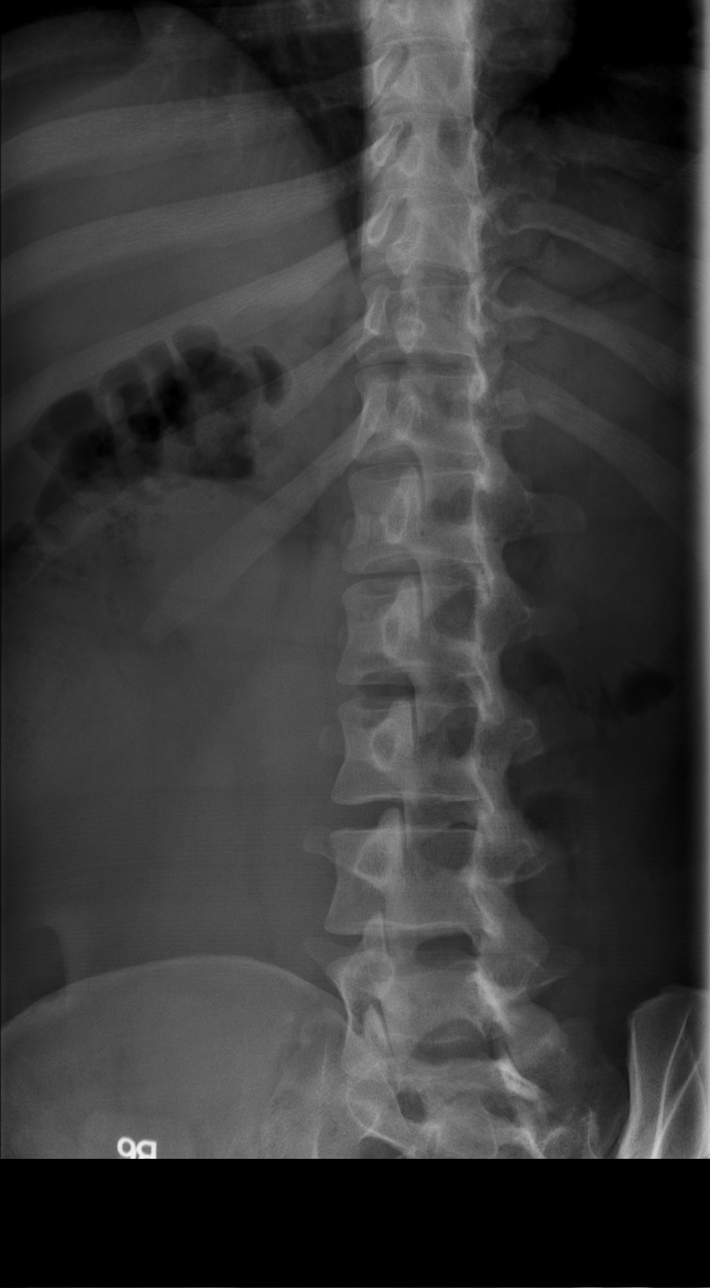

[t lumbar spine obl (2 of 2)]
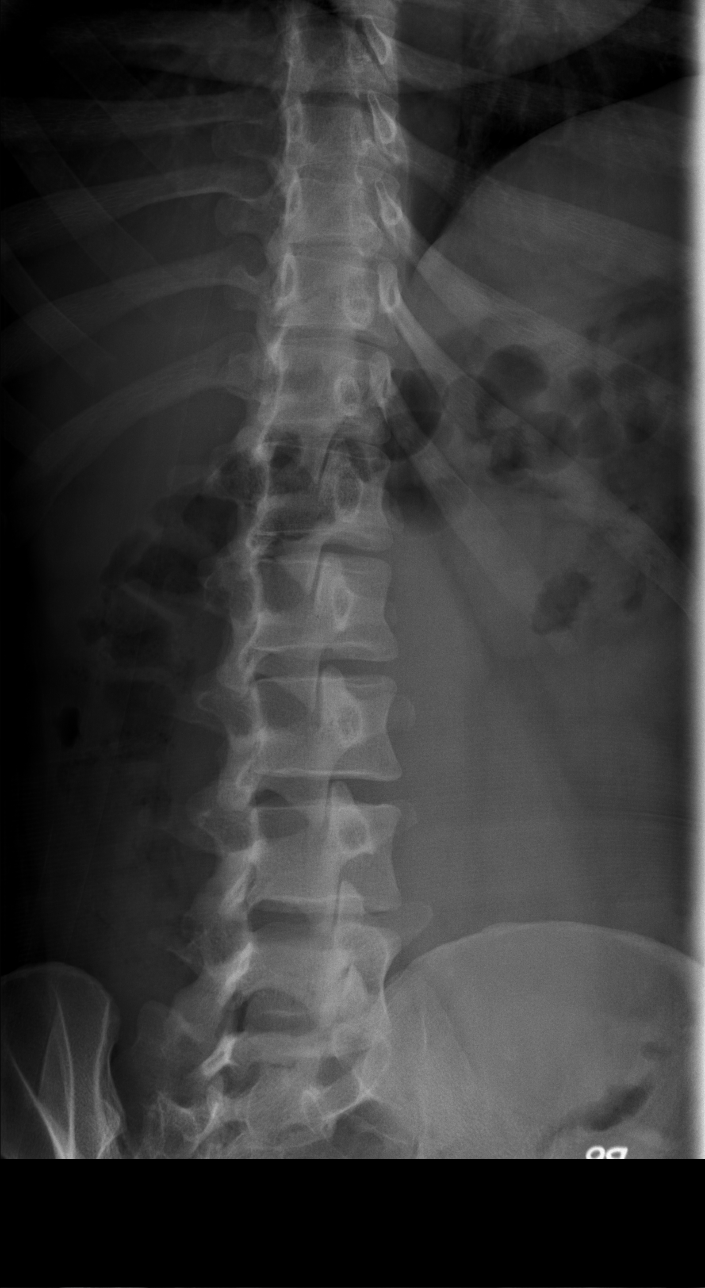

[t lumbar spine lat]
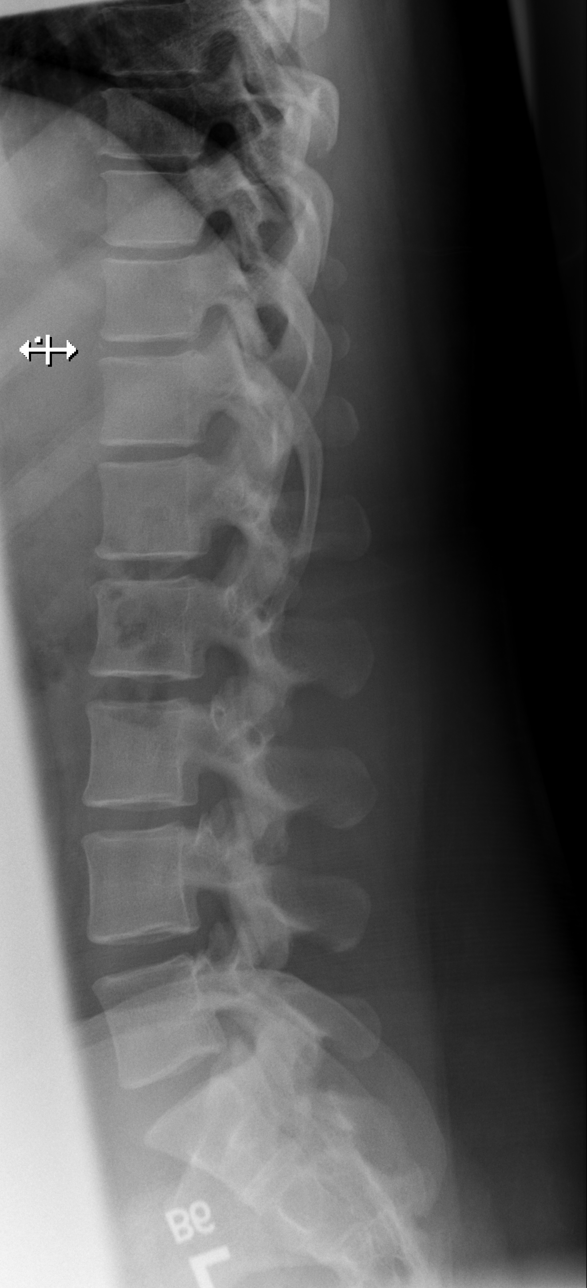

[t lumbar l-5 s-1 spot]
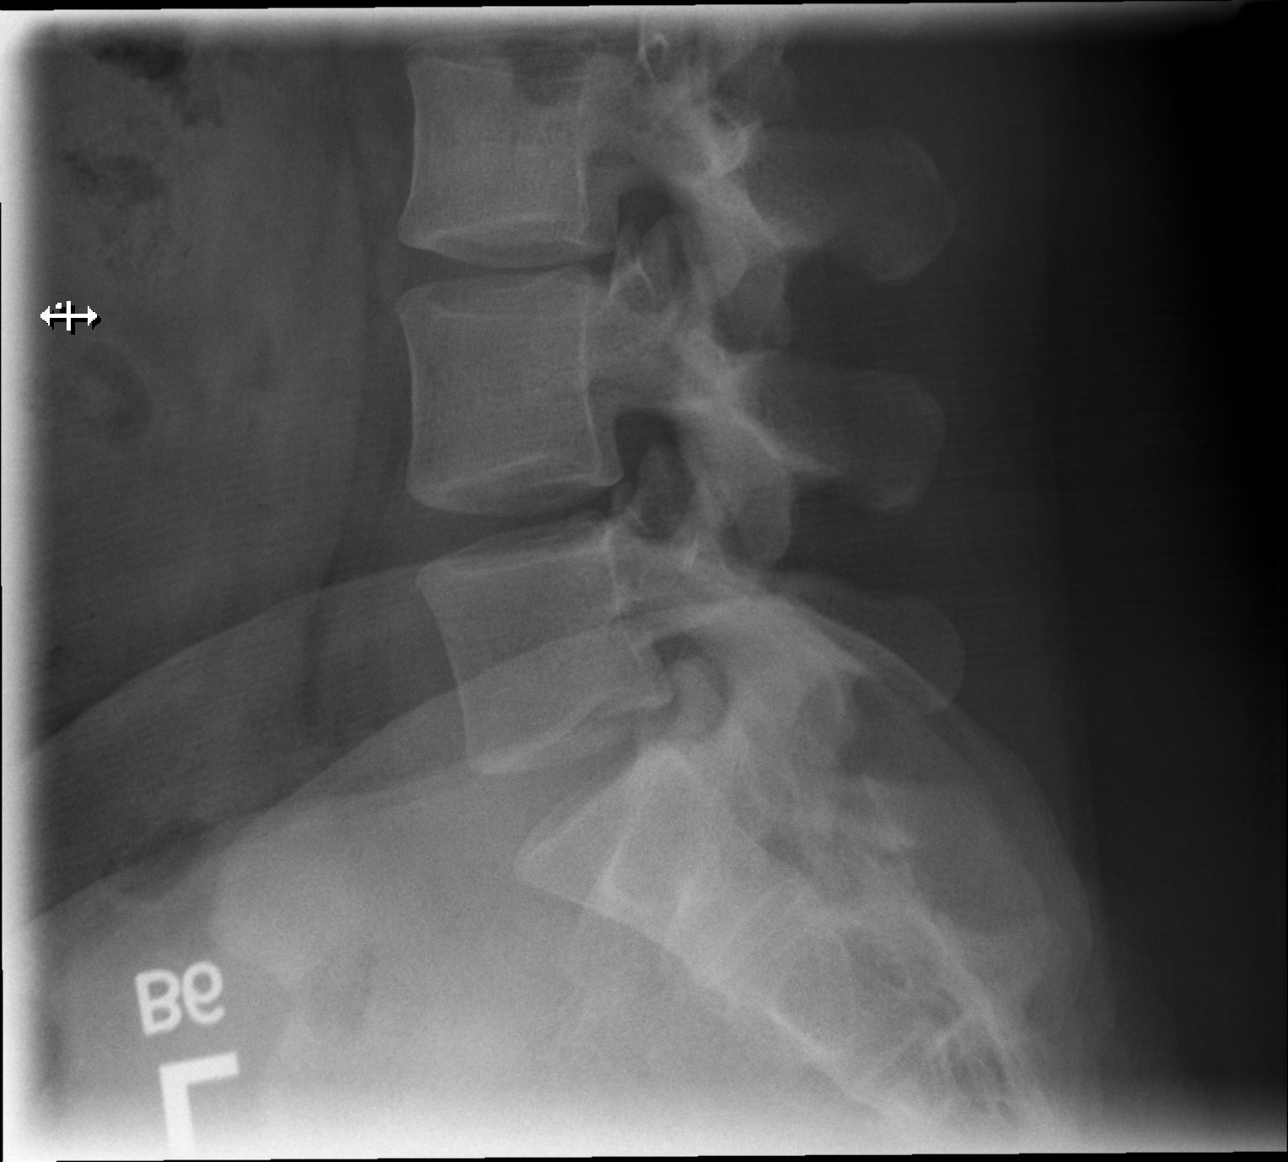

[5 of 5 positions shown; findings below may reference images not displayed]

FINDINGS: There is no evidence of lumbar spine fracture. Alignment is normal.
Intervertebral disc spaces are maintained.
IMPRESSION: No acute abnormality noted.
